# Patient Record
Sex: Male | Born: 1955 | Race: White | Hispanic: No | Marital: Married | Smoking: Never smoker
Health system: Southern US, Community
[De-identification: ages and names within clinical notes are randomized; demographics above are authoritative.]

## PROBLEM LIST (undated history)

## (undated) DIAGNOSIS — I2510 Atherosclerotic heart disease of native coronary artery without angina pectoris: Secondary | ICD-10-CM

## (undated) DIAGNOSIS — K8020 Calculus of gallbladder without cholecystitis without obstruction: Secondary | ICD-10-CM

## (undated) DIAGNOSIS — I779 Disorder of arteries and arterioles, unspecified: Secondary | ICD-10-CM

## (undated) DIAGNOSIS — I10 Essential (primary) hypertension: Secondary | ICD-10-CM

## (undated) DIAGNOSIS — R1319 Other dysphagia: Secondary | ICD-10-CM

## (undated) DIAGNOSIS — Z9889 Other specified postprocedural states: Secondary | ICD-10-CM

## (undated) DIAGNOSIS — E785 Hyperlipidemia, unspecified: Secondary | ICD-10-CM

## (undated) DIAGNOSIS — E119 Type 2 diabetes mellitus without complications: Secondary | ICD-10-CM

## (undated) DIAGNOSIS — R112 Nausea with vomiting, unspecified: Secondary | ICD-10-CM

## (undated) DIAGNOSIS — M79606 Pain in leg, unspecified: Secondary | ICD-10-CM

## (undated) DIAGNOSIS — Z9862 Peripheral vascular angioplasty status: Secondary | ICD-10-CM

## (undated) DIAGNOSIS — K222 Esophageal obstruction: Secondary | ICD-10-CM

## (undated) DIAGNOSIS — I739 Peripheral vascular disease, unspecified: Secondary | ICD-10-CM

## (undated) DIAGNOSIS — K219 Gastro-esophageal reflux disease without esophagitis: Secondary | ICD-10-CM

## (undated) DIAGNOSIS — D369 Benign neoplasm, unspecified site: Secondary | ICD-10-CM

## (undated) HISTORY — PX: KNEE ARTHROSCOPY: SUR90

## (undated) HISTORY — DX: Essential (primary) hypertension: I10

## (undated) HISTORY — DX: Pain in leg, unspecified: M79.606

## (undated) HISTORY — DX: Atherosclerotic heart disease of native coronary artery without angina pectoris: I25.10

## (undated) HISTORY — PX: APPENDECTOMY: SHX54

## (undated) HISTORY — DX: Benign neoplasm, unspecified site: D36.9

## (undated) HISTORY — DX: Other dysphagia: R13.19

## (undated) HISTORY — PX: OTHER SURGICAL HISTORY: SHX169

## (undated) HISTORY — PX: CORONARY ANGIOPLASTY WITH STENT PLACEMENT: SHX49

## (undated) HISTORY — DX: Calculus of gallbladder without cholecystitis without obstruction: K80.20

---

## 2004-05-13 ENCOUNTER — Emergency Department: Payer: Self-pay | Admitting: General Practice

## 2007-05-09 ENCOUNTER — Ambulatory Visit: Payer: Self-pay | Admitting: Unknown Physician Specialty

## 2008-08-10 ENCOUNTER — Ambulatory Visit: Payer: Self-pay | Admitting: Internal Medicine

## 2008-09-02 ENCOUNTER — Ambulatory Visit: Payer: Self-pay | Admitting: Internal Medicine

## 2008-10-03 ENCOUNTER — Ambulatory Visit: Payer: Self-pay | Admitting: Internal Medicine

## 2010-12-15 DIAGNOSIS — I2510 Atherosclerotic heart disease of native coronary artery without angina pectoris: Secondary | ICD-10-CM | POA: Insufficient documentation

## 2010-12-15 DIAGNOSIS — E785 Hyperlipidemia, unspecified: Secondary | ICD-10-CM | POA: Insufficient documentation

## 2010-12-15 HISTORY — DX: Atherosclerotic heart disease of native coronary artery without angina pectoris: I25.10

## 2011-08-10 ENCOUNTER — Ambulatory Visit: Payer: Self-pay | Admitting: Unknown Physician Specialty

## 2011-08-14 LAB — PATHOLOGY REPORT

## 2011-08-20 ENCOUNTER — Ambulatory Visit: Payer: Self-pay | Admitting: Unknown Physician Specialty

## 2011-08-21 DIAGNOSIS — K219 Gastro-esophageal reflux disease without esophagitis: Secondary | ICD-10-CM | POA: Insufficient documentation

## 2011-10-01 ENCOUNTER — Ambulatory Visit: Payer: Self-pay | Admitting: Surgery

## 2011-10-01 LAB — CBC
HCT: 37.8 % — ABNORMAL LOW (ref 40.0–52.0)
HGB: 13.3 g/dL (ref 13.0–18.0)
MCH: 29.3 pg (ref 26.0–34.0)
MCV: 83 fL (ref 80–100)
RBC: 4.56 10*6/uL (ref 4.40–5.90)
WBC: 5.7 10*3/uL (ref 3.8–10.6)

## 2011-10-11 ENCOUNTER — Ambulatory Visit: Payer: Self-pay | Admitting: Surgery

## 2011-10-15 LAB — PATHOLOGY REPORT

## 2012-02-05 ENCOUNTER — Emergency Department: Payer: Self-pay | Admitting: Emergency Medicine

## 2013-02-13 ENCOUNTER — Ambulatory Visit: Payer: Self-pay | Admitting: Family Medicine

## 2013-04-10 ENCOUNTER — Ambulatory Visit: Payer: Self-pay | Admitting: General Practice

## 2013-05-11 ENCOUNTER — Ambulatory Visit: Payer: Self-pay | Admitting: General Practice

## 2013-05-11 LAB — CBC WITH DIFFERENTIAL/PLATELET
Basophil #: 0 10*3/uL (ref 0.0–0.1)
Basophil %: 0.6 %
Eosinophil #: 0.3 10*3/uL (ref 0.0–0.7)
Eosinophil %: 5.7 %
HCT: 40.1 % (ref 40.0–52.0)
HGB: 13.9 g/dL (ref 13.0–18.0)
Lymphocyte #: 1.4 10*3/uL (ref 1.0–3.6)
Lymphocyte %: 26.9 %
MCV: 83 fL (ref 80–100)
Monocyte #: 0.4 x10 3/mm (ref 0.2–1.0)
Monocyte %: 7.2 %
NEUTROS PCT: 59.6 %
Neutrophil #: 3 10*3/uL (ref 1.4–6.5)
Platelet: 186 10*3/uL (ref 150–440)
RBC: 4.84 10*6/uL (ref 4.40–5.90)
WBC: 5 10*3/uL (ref 3.8–10.6)

## 2013-05-11 LAB — BASIC METABOLIC PANEL
Anion Gap: 4 — ABNORMAL LOW (ref 7–16)
BUN: 21 mg/dL — ABNORMAL HIGH (ref 7–18)
Calcium, Total: 8.8 mg/dL (ref 8.5–10.1)
Chloride: 105 mmol/L (ref 98–107)
Co2: 29 mmol/L (ref 21–32)
Creatinine: 0.62 mg/dL (ref 0.60–1.30)
EGFR (African American): >60
EGFR (Non-African Amer.): >60
GLUCOSE: 124 mg/dL — AB (ref 65–99)
Osmolality: 280 (ref 275–301)
Potassium: 4.2 mmol/L (ref 3.5–5.1)
SODIUM: 138 mmol/L (ref 136–145)

## 2013-05-18 ENCOUNTER — Ambulatory Visit: Payer: Self-pay | Admitting: General Practice

## 2013-07-27 DIAGNOSIS — M79606 Pain in leg, unspecified: Secondary | ICD-10-CM | POA: Insufficient documentation

## 2013-07-27 HISTORY — DX: Pain in leg, unspecified: M79.606

## 2013-10-06 ENCOUNTER — Ambulatory Visit: Payer: Self-pay | Admitting: General Practice

## 2013-10-12 ENCOUNTER — Ambulatory Visit: Payer: Self-pay | Admitting: General Practice

## 2013-10-12 LAB — CBC WITH DIFFERENTIAL/PLATELET
Basophil #: 0 x10 3/mm 3 (ref 0.0–0.1)
Basophil %: 0.7 %
Eosinophil #: 0.2 x10 3/mm 3 (ref 0.0–0.7)
Eosinophil %: 5 %
HCT: 40.3 % (ref 40.0–52.0)
HGB: 13.4 g/dL (ref 13.0–18.0)
Lymphocyte %: 29 %
Lymphs Abs: 1.4 x10 3/mm 3 (ref 1.0–3.6)
MCH: 28.6 pg (ref 26.0–34.0)
MCHC: 33.4 g/dL (ref 32.0–36.0)
MCV: 86 fL (ref 80–100)
Monocyte #: 0.4 x10 3/mm (ref 0.2–1.0)
Monocyte %: 8.2 %
Neutrophil #: 2.7 x10 3/mm 3 (ref 1.4–6.5)
Neutrophil %: 57.1 %
Platelet: 177 x10 3/mm 3 (ref 150–440)
RBC: 4.7 x10 6/mm 3 (ref 4.40–5.90)
RDW: 14.1 % (ref 11.5–14.5)
WBC: 4.8 x10 3/mm 3 (ref 3.8–10.6)

## 2013-10-12 LAB — BASIC METABOLIC PANEL WITH GFR
Anion Gap: 5 — ABNORMAL LOW (ref 7–16)
BUN: 18 mg/dL (ref 7–18)
Calcium, Total: 8.4 mg/dL — ABNORMAL LOW (ref 8.5–10.1)
Chloride: 110 mmol/L — ABNORMAL HIGH (ref 98–107)
Co2: 27 mmol/L (ref 21–32)
Creatinine: 0.73 mg/dL (ref 0.60–1.30)
EGFR (African American): >60
EGFR (Non-African Amer.): >60
Glucose: 118 mg/dL — ABNORMAL HIGH (ref 65–99)
Osmolality: 286 (ref 275–301)
Potassium: 4.8 mmol/L (ref 3.5–5.1)
Sodium: 142 mmol/L (ref 136–145)

## 2013-10-19 ENCOUNTER — Ambulatory Visit: Payer: Self-pay | Admitting: General Practice

## 2014-06-22 NOTE — Op Note (Signed)
PATIENT NAME:  Jeremy Luna, Jeremy Luna MR#:  [id number] DATE OF BIRTH:  06-15-1955  DATE OF PROCEDURE:  10/11/2011  PREOPERATIVE DIAGNOSIS: Appendiceal mass.   POSTOPERATIVE DIAGNOSIS: Appendiceal mass.   PROCEDURE: Laparoscopic appendectomy.   SURGEON: Loreli Dollar, MD  ANESTHESIA: General.   INDICATIONS: This 59 year old male recently had colonoscopy with findings of prominent appearance of the appendiceal lumen, evidence of mass formation. CT findings were consistent with this and laparoscopic appendectomy was recommended for definitive treatment.   DESCRIPTION OF PROCEDURE: The patient was placed on the operating table in the supine position under general anesthesia. The abdomen was prepared with ChloraPrep, draped in a sterile manner.   A short incision was made in the inferior aspect of the umbilicus and carried down to the deep fascia which was grasped with laryngeal hook and elevated. A Veress needle was inserted, aspirated, and irrigated with a saline solution. Next, the peritoneal cavity was inflated with carbon dioxide. The Veress needle was removed. The 10 mm cannula was inserted. The 10 mm, 0 degree laparoscope was inserted to view the peritoneal cavity. Another incision was made in the left lower quadrant to insert a 12 mm cannula lateral to the inferior epigastric vessels. Another incision was made in the right lower quadrant to introduce a 5 mm cannula lateral to the inferior epigastric vessels. The cecum was identified. The patient was placed in Trendelenburg position and turned towards the left. The appendix was grasped with Babcock clamp and elevated. The appendix was observed. There appeared to be some bulbous prominence of the appendix at its proximal end adjacent to the cecum. A window was created in the mesoappendix and further enlarged this incision. The blue cartridge Endo GIA stapler was placed across the cecum just about a centimeter above the appendix and was engaged and  activated and then with another load of the blue cartridge the wedge of the cecum was separated placing three rows of staples on each side. Next, the mesoappendix was divided with three loads of the white cartridge Endo GIA stapler. There was some minimal bleeding. Several small bleeding points were cauterized. Some blood was aspirated and hemostasis subsequently appeared to be intact. The appendix was placed into an Endo Catch bag and brought out through the 12 mm port site. It was opened on the side table so that the staple line was partially excised and identified a small area of mass formation and submitted in formalin for routine pathology. Gloves were changed.  The wound was inspected. Just a small amount of blood was aspirated and this operative site was observed for several minutes and subsequently appeared that hemostasis was intact. Next, the cannulas were removed seeing no bleeding from the cannula sites and the carbon dioxide was allowed to escape from the peritoneal cavity. The skin incisions were closed with interrupted 5-0 chromic subcuticular sutures, benzoin, and Steri-Strips. Dressings were applied with paper tape. The patient tolerated surgery satisfactorily and was prepared for transfer to the recovery room.  ____________________________ Lenna Sciara. Rochel Brome, MD jws:cms D: 10/11/2011 08:57:48 ET T: 10/11/2011 09:10:21 ET JOB#: [id number]  cc: Loreli Dollar, MD, <Dictator> Loreli Dollar MD ELECTRONICALLY SIGNED 10/12/2011 19:45

## 2014-06-26 NOTE — Op Note (Signed)
PATIENT NAME:  Jeremy Luna, Jeremy Luna MR#:  [id number] DATE OF BIRTH:  [birth year]  DATE OF PROCEDURE:  10/19/2013  PREOPERATIVE DIAGNOSIS: Internal derangement of the left knee.   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   PROCEDURE PERFORMED: Left knee arthroscopy, partial medial meniscectomy and chondroplasty.   SURGEON: Skip Estimable, MD   ANESTHESIA: General.   ESTIMATED BLOOD LOSS: Minimal.   TOURNIQUET TIME: Not used.   DRAINS: None.   INDICATIONS FOR SURGERY: The patient is a 59 year old male who previously underwent left knee arthroscopy and medial meniscectomy. He had done well until sustaining a fall, at which point he had recurrent swelling of the knee as well as medial joint line pain. After discussion of the risks and benefits of surgical intervention, the patient expressed understanding of the risks and benefits and agreed with plans for surgical intervention.   PROCEDURE IN DETAIL: The patient was brought to the Operating Room and, after adequate general anesthesia was achieved, a tourniquet was placed on the patient's left thigh, and leg was placed in a leg holder. All bony prominences were well padded. The patient's left knee and leg were cleaned and prepped with alcohol and DuraPrep, draped in the usual sterile fashion. A "timeout" was performed as per usual protocol. The anticipated portal sites were injected with 0.25% Marcaine with epinephrine. An anterolateral portal was created and a cannula was inserted. A large bloody effusion was evacuated. The scope was inserted and the knee was distended with fluid using the pump. The scope was advanced down the medial gutter into the medial compartment of the knee. Under visualization with the scope, an anteromedial portal was created and a hook probe was inserted. Inspection of the medial compartment  demonstrated a tear of the posterior horn of the medial meniscus. This was debrided using meniscal punches and a 4.5 mm shaver. Final contouring was performed using an 80 degree ArthroCare wand. The remaining rim of meniscus was visualized and probed and felt to be stable. With range of motion of the knee, the medial femoral condyle was better visualized. There were areas of grade 3 chondromalacia with lesser grade 2 changes to the medial tibial plateau. These areas were debrided and contoured using the ArthroCare device. The knee was removed from the anterolateral portal and reinserted via the anteromedial portal. The scope was positioned so as to visualize the patellofemoral articulation and the notch. The anterior cruciate ligament was visualized and probed and felt to be stable. Good patellar tracking was noted. There was localized round area to the lateral femoral condyle, and this area was debrided using the ArthroCare paragon device. The scope was positioned so as to visualize the lateral compartment. The lateral meniscus was visualized and probed and felt to be stable. The articular surface was in good condition. Finally, the scope was positioned so as to visualize the patellofemoral articulation. The articular surface was in good condition, good patellar tracking was noted. It should be noted that the synovial tissue was extremely inflamed and irritated, was debrided and cauterized using ArthroCare wand. The knee was irrigated with copious amounts of fluid and suctioned dry. The anterolateral portal was reapproximated using 3-0 nylon. A combination of 0.25% Marcaine with epinephrine and 4 mg morphine was injected via the scope. The scope was removed and the anteromedial portal was reapproximated using 3-0 nylon. A sterile dressing was applied followed by application of ice wrap.  The patient tolerated the procedure well. He was transported to the recovery room in stable  condition.   ____________________________ Laurice Record. Holley Bouche., MD jph:TT D: 10/19/2013 17:49:29 ET T: 10/19/2013 20:49:31 ET JOB#: [id number]  cc: Laurice Record. Holley Bouche., MD, <Dictator> JAMES P Holley Bouche MD ELECTRONICALLY SIGNED 11/11/2013 7:08

## 2014-06-26 NOTE — Op Note (Signed)
PATIENT NAME:  Jeremy Luna, Jeremy Luna MR#:  [id number] DATE OF BIRTH:  1955/12/20  DATE OF PROCEDURE:  05/18/2013  PREOPERATIVE DIAGNOSIS: Internal derangement of the left knee.   POSTOPERATIVE DIAGNOSES:  1.  Tear of the posterior horn medial meniscus, left knee.  2.  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   PROCEDURE PERFORMED: Left knee arthroscopy, partial medial meniscectomy and medial chondroplasty.   SURGEON: Laurice Record. Hooten, MD.   ANESTHESIA: General.   ESTIMATED BLOOD LOSS: Minimal.   TOURNIQUET TIME: Not used.   DRAINS: None.   FLUIDS REPLACED: 700 mL of crystalloid.   INDICATIONS FOR SURGERY: The patient is a 59 year old gentleman, who has been seen for complaints of persistent left knee pain and swelling. MRI demonstrated findings consistent with meniscal pathology. After discussion of the risks and benefits of surgical intervention, the patient expressed understanding of the risks and benefits, and agreed with plans for surgical intervention.   PROCEDURE IN DETAIL: The patient was brought in the operating room and then, after adequate general anesthesia was achieved, a tourniquet was placed on the patient's left thigh and placed a leg holder. All bony prominences were well padded. The patient's left knee and leg were cleaned and prepped with alcohol and DuraPrep and draped in the usual sterile fashion. A "timeout" was performed as per usual protocol. The anticipated portal sites were injected with 0.25% Marcaine with epinephrine. An anterolateral portal was created and a cannula was inserted. A large effusion was evacuated. The scope was inserted and the knee was tented with fluid using the Linvatec pump. The scope was advanced down the medial gutter and into the medial compartment of the knee. Under visualization with the scope, an anteromedial portal was created and hook probe was inserted. There was a complex tear of the posterior horn of the medial meniscus.  This was debrided using a combination of meniscal punches and a 4.5 mm shaver. The remaining rim of meniscus was visualized and probed and felt to be stable.   Inspection of the articular surface demonstrated some grade 3 changes of chondromalacia involving the medial femoral condyle. These areas were debrided and contoured using the 50 degree ArthroCare wand. The scope was then advanced into the intracondylar region. The anterior cruciate ligament was visualized and probed and felt to be stable. The scope was removed from the anterolateral portal and reinserted in the anteromedial portal so as to better visualize the lateral compartment. The lateral meniscus was visualized and probed and felt to be stable. The articular surface was visualized and felt to be in excellent condition. Finally, the scope was positioned so as to visualize the patellofemoral articulation. Good patellar tracking was noted. The articular surface was in good condition.   The knee was irrigated with copious amounts of fluid and then suctioned dry. The anterolateral portal was reapproximated using 3-0 nylon. A combination of 0.25% Marcaine with epinephrine and 4 mg morphine was injected via the scope. The scope was removed and the anteromedial portal was reapproximated using 3-0 nylon. Sterile dressing was applied followed by application of an Ace wrap. The patient tolerated the procedure well. He was transported to the recovery room in stable condition.   ____________________________ Laurice Record. Holley Bouche., MD jph:aw D: 05/18/2013 23:03:50 ET T: 05/19/2013 07:47:24 ET JOB#: [id number]  cc: Laurice Record. Holley Bouche., MD, <Dictator> Laurice Record Holley Bouche MD ELECTRONICALLY SIGNED 05/24/2013 8:44

## 2014-09-27 DIAGNOSIS — E119 Type 2 diabetes mellitus without complications: Secondary | ICD-10-CM | POA: Insufficient documentation

## 2015-01-22 ENCOUNTER — Encounter: Payer: Self-pay | Admitting: Gynecology

## 2015-01-22 ENCOUNTER — Ambulatory Visit
Admission: EM | Admit: 2015-01-22 | Discharge: 2015-01-22 | Disposition: A | Discharge status: Home / Self Care | Payer: BLUE CROSS/BLUE SHIELD | Attending: Internal Medicine | Admitting: Internal Medicine

## 2015-01-22 DIAGNOSIS — S91331A Puncture wound without foreign body, right foot, initial encounter: Secondary | ICD-10-CM

## 2015-01-22 HISTORY — DX: Essential (primary) hypertension: I10

## 2015-01-22 HISTORY — DX: Gastro-esophageal reflux disease without esophagitis: K21.9

## 2015-01-22 HISTORY — DX: Hyperlipidemia, unspecified: E78.5

## 2015-01-22 HISTORY — DX: Peripheral vascular angioplasty status: Z98.62

## 2015-01-22 HISTORY — DX: Type 2 diabetes mellitus without complications: E11.9

## 2015-01-22 HISTORY — DX: Atherosclerotic heart disease of native coronary artery without angina pectoris: I25.10

## 2015-01-22 MED ORDER — TETANUS-DIPHTH-ACELL PERTUSSIS 5-2.5-18.5 LF-MCG/0.5 IM SUSP
0.5000 mL | Freq: Once | INTRAMUSCULAR | Status: AC
  Administered 2015-01-22: 0.5 mL via INTRAMUSCULAR

## 2015-01-22 MED ORDER — AMOXICILLIN-POT CLAVULANATE 875-125 MG PO TABS: 1.0000 | ORAL_TABLET | Freq: Two times a day (BID) | ORAL | Status: AC

## 2015-01-22 NOTE — Discharge Instructions (Signed)
Tetanus shot was given at the urgent care today. Prescription for augmentin (amoxicillin/clavulanate, an antibiotic) was sent to the [facility] in [place] for any increasing redness/swelling/pain/drainage, or new fever >100.5  Puncture Wound A puncture wound is an injury that is caused by a sharp, thin object that goes through (penetrates) your skin. Usually, a puncture wound does not leave a large opening in your skin, so it may not bleed a lot. However, when you get a puncture wound, dirt or other materials (foreign bodies) can be forced into your wound and break off inside. This increases the chance of infection, such as tetanus. CAUSES Puncture wounds are caused by any sharp, thin object that goes through your skin, such as:  Animal teeth, as with an animal bite.  Sharp, pointed objects, such as nails, splinters of glass, fishhooks, and needles. SYMPTOMS Symptoms of a puncture wound include:  Pain.  Bleeding.  Swelling.  Bruising.  Fluid leaking from the wound.  Numbness, tingling, or loss of function. DIAGNOSIS This condition is diagnosed with a medical history and physical exam. Your wound will be checked to see if it contains any foreign bodies. You may also have X-rays or other imaging tests. TREATMENT Treatment for a puncture wound depends on how serious the wound is. It also depends on whether the wound contains any foreign bodies. Treatment for all types of puncture wounds usually starts with:  Controlling the bleeding.  Washing out the wound with a germ-free (sterile) salt-water solution.  Checking the wound for foreign bodies. Treatment may also include:  Having the wound opened surgically to remove a foreign object.  Closing the wound with stitches (sutures) if it continues to bleed.  Covering the wound with antibiotic ointments and a bandage (dressing).  Receiving a tetanus shot.  Receiving a rabies vaccine. HOME CARE INSTRUCTIONS Medicines  Take or  apply over-the-counter and prescription medicines only as told by your health care provider.  If you were prescribed an antibiotic, take or apply it as told by your health care provider. Do not stop using the antibiotic even if your condition improves. Wound Care  There are many ways to close and cover a wound. For example, a wound can be covered with sutures, skin glue, or adhesive strips. Follow instructions from your health care provider about:  How to take care of your wound.  When and how you should change your dressing.  When you should remove your dressing.  Removing whatever was used to close your wound.  Keep the dressing dry as told by your health care provider. Do not take baths, swim, use a hot tub, or do anything that would put your wound underwater until your health care provider approves.  Clean the wound as told by your health care provider.  Do not scratch or pick at the wound.  Check your wound every day for signs of infection. Watch for:  Redness, swelling, or pain.  Fluid, blood, or pus. General Instructions  Raise (elevate) the injured area above the level of your heart while you are sitting or lying down.  If your puncture wound is in your foot, ask your health care provider if you need to avoid putting weight on your foot and for how long.  Keep all follow-up visits as told by your health care provider. This is important. SEEK MEDICAL CARE IF:  You received a tetanus shot and you have swelling, severe pain, redness, or bleeding at the injection site.  You have a fever.  Your  sutures come out.  You notice a bad smell coming from your wound or your dressing.  You notice something coming out of your wound, such as wood or glass.  Your pain is not controlled with medicine.  You have increased redness, swelling, or pain at the site of your wound.  You have fluid, blood, or pus coming from your wound.  You notice a change in the color of your skin  near your wound.  You need to change the dressing frequently due to fluid, blood, or pus draining from your wound.  You develop a new rash.  You develop numbness around your wound. SEEK IMMEDIATE MEDICAL CARE IF:  You develop severe swelling around your wound.  Your pain suddenly increases and is severe.  You develop painful skin lumps.  You have a red streak going away from your wound.  The wound is on your hand or foot and you cannot properly move a finger or toe.  The wound is on your hand or foot and you notice that your fingers or toes look pale or bluish.   This information is not intended to replace advice given to you by your health care provider. Make sure you discuss any questions you have with your health care provider.   Document Released: 11/29/2004 Document Revised: 11/10/2014 Document Reviewed: 04/14/2014 Elsevier Interactive Patient Education [employer].

## 2015-01-22 NOTE — ED Notes (Signed)
Patient c/o nail puncture in right foot x yesterday .

## 2015-01-22 NOTE — ED Provider Notes (Signed)
CSN: [id number]     Arrival date & time 01/22/15  0840 History   First MD Initiated Contact with Patient 01/22/15 [phone]     Chief Complaint  Patient presents with  . Foot Injury   HPI  Patient is a 59 year old gentleman with history of diabetes. He stepped on a nail while walking near his burn pile yesterday. The nail was about an inch and a half long, and penetrated the sole of his right foot, at the ball, through his boot. Penetration was not deep. He is here to have this checked today and get a tetanus update. He was able to walk around all day yesterday, and walked into the urgent care today without assistive device or difficulty. No fever, no pain/swelling/redness/drainage at site. Feels fine otherwise  Past Medical History  Diagnosis Date  . Hypertension   . Diabetes mellitus without complication ([place])   . Dyslipidemia   . H/O angioplasty   . GERD (gastroesophageal reflux disease)   . CAD (coronary artery disease)    Past Surgical History  Procedure Laterality Date  . Colonscopy    . Coronary angioplasty with stent placement    . Appendectomy    . Knee arthroscopy Left     Social History  Substance Use Topics  . Smoking status: Never Smoker   . Smokeless tobacco: None  . Alcohol Use: Yes    Review of Systems  All other systems reviewed and are negative.   Allergies  Review of patient's allergies indicates no known allergies.  Home Medications   Prior to Admission medications   Medication Sig Start Date End Date Taking? Authorizing Provider  aspirin 81 MG tablet Take 81 mg by mouth daily.   Yes Historical Provider, MD  atorvastatin (LIPITOR) 40 MG tablet Take 40 mg by mouth daily.   Yes Historical Provider, MD  lisinopril (PRINIVIL,ZESTRIL) 20 MG tablet Take 20 mg by mouth daily.   Yes Historical Provider, MD  metFORMIN (GLUCOPHAGE) 500 MG tablet Take by mouth 2 (two) times daily with a meal.   Yes Historical Provider, MD  naproxen sodium (ANAPROX) 220 MG tablet  Take 220 mg by mouth 2 (two) times daily with a meal.   Yes Historical Provider, MD  nitroGLYCERIN (NITROSTAT) 0.4 MG SL tablet Place 0.4 mg under the tongue every 5 (five) minutes as needed for chest pain.   Yes Historical Provider, MD          Meds Ordered and Administered this Visit   Medications  Tdap (BOOSTRIX) injection 0.5 mL (0.5 mLs Intramuscular Given 01/22/15 0918)    BP 120/71 mmHg  Pulse 65  Temp(Src) 98 F (36.7 C) (Oral)  Resp 18  Ht 5\' 10"  (1.778 m)  Wt 200 lb 6.4 oz (90.901 kg)  BMI 28.75 kg/m2  SpO2 98%   Physical Exam  Constitutional: He is oriented to person, place, and time. No distress.  Alert, nicely groomed  HENT:  Head: Atraumatic.  Eyes:  Conjugate gaze, no eye redness/drainage  Neck: Neck supple.  Cardiovascular: Normal rate.   Pulmonary/Chest: No respiratory distress.  Abdominal: Soft. He exhibits no distension.  Musculoskeletal: He exhibits no edema.  Neurological: He is alert and oriented to person, place, and time.  Skin: Skin is warm and dry.  No cyanosis Skin of the feet is a little atrophic Ball of the right foot with tiny puncture mark at approximately the level of the head of the second metatarsal. No redness/swelling, nontender, not able to express any drainage  or bleeding. Not bruised.  Nursing note and vitals reviewed.   ED Course  Procedures (including critical care time)      MDM   1. Puncture wound of foot, right, initial encounter    Discharge Medication List as of 01/22/2015  9:26 AM    START taking these medications   Details  amoxicillin-clavulanate (AUGMENTIN) 875-125 MG tablet Take 1 tablet by mouth every 12 (twelve) hours., Starting 01/22/2015, Until Thu 01/27/15, Normal       Recheck for any increased redness/swelling/pain/drainage, new fever greater than 100.5. Has a history of vascular disease and diabetes, prophylactic antibiotics are given today.    Sherlene Shams, MD 01/22/15 [phone]

## 2016-04-02 DIAGNOSIS — D369 Benign neoplasm, unspecified site: Secondary | ICD-10-CM

## 2016-04-02 DIAGNOSIS — I10 Essential (primary) hypertension: Secondary | ICD-10-CM

## 2016-04-02 HISTORY — DX: Benign neoplasm, unspecified site: D36.9

## 2016-04-02 HISTORY — DX: Essential (primary) hypertension: I10

## 2016-06-22 ENCOUNTER — Other Ambulatory Visit: Payer: Self-pay | Admitting: Nurse Practitioner

## 2016-06-22 DIAGNOSIS — R1319 Other dysphagia: Secondary | ICD-10-CM

## 2016-06-22 HISTORY — DX: Other dysphagia: R13.19

## 2016-06-27 ENCOUNTER — Ambulatory Visit
Admission: RE | Admit: 2016-06-27 | Discharge: 2016-06-27 | Disposition: A | Discharge status: Home / Self Care | Payer: BLUE CROSS/BLUE SHIELD | Source: Ambulatory Visit | Attending: Nurse Practitioner | Admitting: Nurse Practitioner

## 2016-06-27 DIAGNOSIS — K224 Dyskinesia of esophagus: Secondary | ICD-10-CM | POA: Diagnosis not present

## 2016-06-27 DIAGNOSIS — K449 Diaphragmatic hernia without obstruction or gangrene: Secondary | ICD-10-CM | POA: Diagnosis not present

## 2016-06-27 DIAGNOSIS — K219 Gastro-esophageal reflux disease without esophagitis: Secondary | ICD-10-CM | POA: Diagnosis not present

## 2016-06-27 DIAGNOSIS — K222 Esophageal obstruction: Secondary | ICD-10-CM | POA: Diagnosis not present

## 2016-06-27 DIAGNOSIS — R1319 Other dysphagia: Secondary | ICD-10-CM

## 2016-07-23 ENCOUNTER — Ambulatory Visit
Admission: EM | Admit: 2016-07-23 | Discharge: 2016-07-23 | Disposition: A | Discharge status: Home / Self Care | Payer: BLUE CROSS/BLUE SHIELD | Attending: Family Medicine | Admitting: Family Medicine

## 2016-07-23 ENCOUNTER — Ambulatory Visit (INDEPENDENT_AMBULATORY_CARE_PROVIDER_SITE_OTHER)
Admit: 2016-07-23 | Discharge: 2016-07-23 | Disposition: A | Discharge status: Home / Self Care | Payer: BLUE CROSS/BLUE SHIELD | Attending: Family Medicine | Admitting: Family Medicine

## 2016-07-23 DIAGNOSIS — K8020 Calculus of gallbladder without cholecystitis without obstruction: Secondary | ICD-10-CM

## 2016-07-23 LAB — URINALYSIS, COMPLETE (UACMP) WITH MICROSCOPIC
Bacteria, UA: NONE SEEN
Bilirubin Urine: NEGATIVE
Glucose, UA: 100 mg/dL — AB
Hgb urine dipstick: NEGATIVE
Leukocytes, UA: NEGATIVE
Nitrite: NEGATIVE
pH: 5 (ref 5.0–8.0)

## 2016-07-23 LAB — CBC WITH DIFFERENTIAL/PLATELET
Basophils Absolute: 0.1 10*3/uL (ref 0–0.1)
Basophils Relative: 1 %
EOS ABS: 0.3 10*3/uL (ref 0–0.7)
Eosinophils Relative: 4 %
HCT: 42 % (ref 40.0–52.0)
HEMOGLOBIN: 14 g/dL (ref 13.0–18.0)
LYMPHS ABS: 1.7 10*3/uL (ref 1.0–3.6)
Lymphocytes Relative: 24 %
MCH: 28.1 pg (ref 26.0–34.0)
MCHC: 33.4 g/dL (ref 32.0–36.0)
MCV: 84 fL (ref 80.0–100.0)
Monocytes Absolute: 0.5 10*3/uL (ref 0.2–1.0)
Monocytes Relative: 7 %
NEUTROS ABS: 4.5 10*3/uL (ref 1.4–6.5)
Neutrophils Relative %: 64 %
Platelets: 180 10*3/uL (ref 150–440)
RBC: 5 MIL/uL (ref 4.40–5.90)
RDW: 13.7 % (ref 11.5–14.5)
WBC: 7 10*3/uL (ref 3.8–10.6)

## 2016-07-23 LAB — COMPREHENSIVE METABOLIC PANEL
ALBUMIN: 4.5 g/dL (ref 3.5–5.0)
ALK PHOS: 67 U/L (ref 38–126)
ALT: 22 U/L (ref 17–63)
ANION GAP: 7 (ref 5–15)
AST: 21 U/L (ref 15–41)
BILIRUBIN TOTAL: 1.1 mg/dL (ref 0.3–1.2)
BUN: 27 mg/dL — ABNORMAL HIGH (ref 6–20)
CALCIUM: 9.2 mg/dL (ref 8.9–10.3)
CO2: 26 mmol/L (ref 22–32)
Chloride: 105 mmol/L (ref 101–111)
Creatinine, Ser: 0.71 mg/dL (ref 0.61–1.24)
GFR calc non Af Amer: >60 mL/min (ref >60)
Glucose, Bld: 137 mg/dL — ABNORMAL HIGH (ref 65–99)
POTASSIUM: 4.6 mmol/L (ref 3.5–5.1)
SODIUM: 138 mmol/L (ref 135–145)
TOTAL PROTEIN: 7.1 g/dL (ref 6.5–8.1)

## 2016-07-23 NOTE — ED Triage Notes (Signed)
Patient complains of right side flank pain that started last night radiating to the front. Patient states that he took ibuprofen and eased off some. Patient states that this morning he feels full and also had some burning with urination.

## 2016-07-23 NOTE — ED Provider Notes (Signed)
[facility] ____________________________________________  Time seen: Approximately 8:32 AM  I have reviewed the triage vital signs and the nursing notes.   HISTORY  Chief Complaint Flank Pain and Dysuria  HPI Jeremy Luna is a 61 y.o. male presenting for evaluation of right upper quadrant and right flank pain with single episode of dysuria that happened last night. Patient reports he was sitting at home and had onset of right flank pain that was present and described as severe between 10 PM and midnight and then improved. Patient reports he had single episode of some burning with urination but has not had any since. Denies any blood in urine, bowel issues, a decreased urinary stream or flow. Denies any penile discharge, penile or testicular pain or swelling. Denies any rectal pain. Reports continues with normal bowel movements, last bowel movement was today. Denies food triggers or history of similar in the past.  Denies fall, injury or trauma. Patient reports holding pressure to his right upper quadrant and right side as well as taking over-the-counter ibuprofen did help with discomfort. Denies any aggravating factors. Reports no previous abdominal surgeries. Denies pain radiation. Denies history of kidney stones.Denies concerns of STDs.   Denies chest pain, shortness of breath, nausea, vomiting, diarrhea, fevers, dysuria, extremity pain, extremity swelling or rash. Denies recent sickness. Denies recent antibiotic use.   PCP Loleta Chance  Past Medical History:  Diagnosis Date  . CAD (coronary artery disease)   . Diabetes mellitus without complication ([place])   . Dyslipidemia   . GERD (gastroesophageal reflux disease)   . H/O angioplasty   . Hypertension     There are no active problems to display for this patient.   Past Surgical History:  Procedure Laterality Date  . APPENDECTOMY    . colonscopy    . CORONARY ANGIOPLASTY WITH STENT PLACEMENT    . KNEE ARTHROSCOPY Left       No current facility-administered medications for this encounter.   Current Outpatient Prescriptions:  .  aspirin 81 MG tablet, Take 81 mg by mouth daily., Disp: , Rfl:  .  atorvastatin (LIPITOR) 40 MG tablet, Take 40 mg by mouth daily., Disp: , Rfl:  .  lisinopril (PRINIVIL,ZESTRIL) 20 MG tablet, Take 20 mg by mouth daily., Disp: , Rfl:  .  metFORMIN (GLUCOPHAGE) 500 MG tablet, Take by mouth 2 (two) times daily with a meal., Disp: , Rfl:  .  naproxen sodium (ANAPROX) 220 MG tablet, Take 220 mg by mouth 2 (two) times daily with a meal., Disp: , Rfl:  .  nitroGLYCERIN (NITROSTAT) 0.4 MG SL tablet, Place 0.4 mg under the tongue every 5 (five) minutes as needed for chest pain., Disp: , Rfl:  .  omeprazole (PRILOSEC) 20 MG capsule, Take 20 mg by mouth daily., Disp: , Rfl:   Allergies Patient has no known allergies.  family history Father: DM, MI  Social History Social History  Substance Use Topics  . Smoking status: Never Smoker  . Smokeless tobacco: Never Used  . Alcohol use Yes    Review of Systems Constitutional: No fever/chills Cardiovascular: Denies chest pain. Respiratory: Denies shortness of breath. Gastrointestinal: No nausea, no vomiting.  No diarrhea.  No constipation. Genitourinary: As above.  Musculoskeletal: Negative for back pain. Skin: Negative for rash.   ____________________________________________   PHYSICAL EXAM:  VITAL SIGNS: ED Triage Vitals  Enc Vitals Group     BP 07/23/16 0824 128/81     Pulse Rate 07/23/16 0824 65  Resp 07/23/16 0824 18     Temp 07/23/16 0824 97.7 F (36.5 C)     Temp Source 07/23/16 0824 Oral     SpO2 07/23/16 0824 97 %     Weight 07/23/16 0822 199 lb (90.3 kg)     Height 07/23/16 0822 5\' 11"  (1.803 m)     Head Circumference --      Peak Flow --      Pain Score 07/23/16 0823 2     Pain Loc --      Pain Edu? --      Excl. in [place]? --     Constitutional: Alert and oriented. Well appearing and in no acute  distress. ENT      Head: Normocephalic and atraumatic. Cardiovascular: Normal rate, regular rhythm. Grossly normal heart sounds.  Good peripheral circulation. Respiratory: Normal respiratory effort without tachypnea nor retractions. Breath sounds are clear and equal bilaterally. No wheezes, rales, rhonchi. Gastrointestinal: Soft. No distention. Normal Bowel sounds. Minimal RUQ and right flank pain, abdomen otherwise soft and nontender.  Musculoskeletal:  Steady gait. No midline cervical, thoracic or lumbar tenderness to palpation. Neurologic:  Normal speech and language. Speech is normal. No gait instability.  Skin:  Skin is warm, dry. Psychiatric: Mood and affect are normal. Speech and behavior are normal. Patient exhibits appropriate insight and judgment   ___________________________________________   LABS (all labs ordered are listed, but only abnormal results are displayed)  Labs Reviewed  URINALYSIS, COMPLETE (UACMP) WITH MICROSCOPIC - Abnormal; Notable for the following:       Result Value   Specific Gravity, Urine >1.030 (*)    Glucose, UA 100 (*)    Ketones, ur TRACE (*)    Protein, ur TRACE (*)    Squamous Epithelial / LPF 0-5 (*)    All other components within normal limits  COMPREHENSIVE METABOLIC PANEL - Abnormal; Notable for the following:    Glucose, Bld 137 (*)    BUN 27 (*)    All other components within normal limits  URINE CULTURE  CBC WITH DIFFERENTIAL/PLATELET    RADIOLOGY  Ct Renal Stone Study  Result Date: 07/23/2016 CLINICAL DATA:  Right upper quadrant and right flank pain. EXAM: CT ABDOMEN AND PELVIS WITHOUT CONTRAST TECHNIQUE: Multidetector CT imaging of the abdomen and pelvis was performed following the standard protocol without IV contrast. COMPARISON:  08/20/2011 FINDINGS: Lower chest: Dense coronary artery calcifications diffusely throughout the visualized coronary arteries. Heart is normal size. No effusions. Lung bases clear. Hepatobiliary: Small  stones within the gallbladder. No focal hepatic abnormality. Pancreas: No focal abnormality or ductal dilatation. Spleen: No focal abnormality.  Normal size. Adrenals/Urinary Tract: No adrenal abnormality. No focal renal abnormality. No stones or hydronephrosis. Urinary bladder is unremarkable. Stomach/Bowel: Prior appendectomy. Scattered sigmoid diverticula. Stomach and small bowel unremarkable. Vascular/Lymphatic: Scattered aortic calcifications. No evidence of aneurysm or adenopathy. Reproductive: No visible focal abnormality.  Prostate enlargement. Other: No free fluid or free air. Musculoskeletal: No acute bony abnormality. IMPRESSION: Cholelithiasis. No renal or ureteral stones.  No hydronephrosis. Diffuse extensive coronary artery disease. Electronically Signed   By: Rolm Baptise M.D.   On: 07/23/2016 10:06   ____________________________________________   PROCEDURES Procedures   INITIAL IMPRESSION / ASSESSMENT AND PLAN / ED COURSE  Pertinent labs & imaging results that were available during my care of the patient were reviewed by me and considered in my medical decision making (see chart for details).  Well-appearing patient. No acute distress. Discussed with patient concern for nephrolithiasis  versus cholelithiasis. Discussed the patient will evaluate laboratory studies as well CT renal. Labs reviewed and discussed with patient. CT renal per radiologist cholelithiasis, no renal or ureteral stones, no hydronephrosis and diffuse extensive coronary artery disease. Discussed these results in detail with patient. Patient states at this time he is not having any pain. Patient tolerating fluids well in urgent care. 2 ties complaints at this time. Discussed with patient and encourage follow-up with primary care physician and likely outpatient ultrasound. Also discussed with patient diet contributions to gallstones. Encouraged supportive care and outpatient follow-up. Discussed strict follow-up and  return parameters.  Discussed follow up with Primary care physician this week. Discussed follow up and return parameters including no resolution or any worsening concerns. Patient verbalized understanding and agreed to plan.   ____________________________________________   FINAL CLINICAL IMPRESSION(S) / ED DIAGNOSES  Final diagnoses:  Calculus of gallbladder without cholecystitis without obstruction     Discharge Medication List as of 07/23/2016 10:34 AM      Note: This dictation was prepared with Dragon dictation along with smaller phrase technology. Any transcriptional errors that result from this process are unintentional.         Marylene Land, NP 07/23/16 2139

## 2016-07-23 NOTE — Discharge Instructions (Signed)
Monitor diet.   Follow up with your primary care physician tin one week as discussed.  Return to Urgent care for new or worsening concerns. \

## 2016-07-23 NOTE — ED Notes (Signed)
Prior Authorization obtained for CT Renal- AUTH [id number].

## 2016-07-24 LAB — URINE CULTURE: CULTURE: NO GROWTH

## 2016-07-25 ENCOUNTER — Emergency Department: Payer: BLUE CROSS/BLUE SHIELD

## 2016-07-25 ENCOUNTER — Encounter: Payer: Self-pay | Admitting: Emergency Medicine

## 2016-07-25 ENCOUNTER — Emergency Department
Admission: EM | Admit: 2016-07-25 | Discharge: 2016-07-25 | Disposition: A | Discharge status: Home / Self Care | Payer: BLUE CROSS/BLUE SHIELD | Attending: Emergency Medicine | Admitting: Emergency Medicine

## 2016-07-25 DIAGNOSIS — K8020 Calculus of gallbladder without cholecystitis without obstruction: Secondary | ICD-10-CM | POA: Diagnosis not present

## 2016-07-25 DIAGNOSIS — I10 Essential (primary) hypertension: Secondary | ICD-10-CM | POA: Insufficient documentation

## 2016-07-25 DIAGNOSIS — I2510 Atherosclerotic heart disease of native coronary artery without angina pectoris: Secondary | ICD-10-CM | POA: Insufficient documentation

## 2016-07-25 DIAGNOSIS — Z79899 Other long term (current) drug therapy: Secondary | ICD-10-CM | POA: Diagnosis not present

## 2016-07-25 DIAGNOSIS — K8050 Calculus of bile duct without cholangitis or cholecystitis without obstruction: Secondary | ICD-10-CM | POA: Insufficient documentation

## 2016-07-25 DIAGNOSIS — R109 Unspecified abdominal pain: Secondary | ICD-10-CM

## 2016-07-25 DIAGNOSIS — E119 Type 2 diabetes mellitus without complications: Secondary | ICD-10-CM | POA: Diagnosis not present

## 2016-07-25 DIAGNOSIS — Z7984 Long term (current) use of oral hypoglycemic drugs: Secondary | ICD-10-CM | POA: Diagnosis not present

## 2016-07-25 DIAGNOSIS — R1011 Right upper quadrant pain: Secondary | ICD-10-CM | POA: Diagnosis present

## 2016-07-25 HISTORY — DX: Calculus of gallbladder without cholecystitis without obstruction: K80.20

## 2016-07-25 LAB — CBC WITH DIFFERENTIAL/PLATELET
BASOS ABS: 0 10*3/uL (ref 0–0.1)
Basophils Relative: 1 %
Eosinophils Absolute: 0.3 10*3/uL (ref 0–0.7)
Eosinophils Relative: 5 %
HEMATOCRIT: 39.3 % — AB (ref 40.0–52.0)
Hemoglobin: 13.7 g/dL (ref 13.0–18.0)
LYMPHS PCT: 27 %
Lymphs Abs: 1.5 10*3/uL (ref 1.0–3.6)
MCH: 28.8 pg (ref 26.0–34.0)
MCHC: 34.8 g/dL (ref 32.0–36.0)
MCV: 82.9 fL (ref 80.0–100.0)
Monocytes Absolute: 0.4 10*3/uL (ref 0.2–1.0)
Monocytes Relative: 7 %
NEUTROS ABS: 3.3 10*3/uL (ref 1.4–6.5)
Neutrophils Relative %: 60 %
Platelets: 157 10*3/uL (ref 150–440)
RBC: 4.74 MIL/uL (ref 4.40–5.90)
RDW: 13.4 % (ref 11.5–14.5)
WBC: 5.5 10*3/uL (ref 3.8–10.6)

## 2016-07-25 LAB — COMPREHENSIVE METABOLIC PANEL
ALT: 20 U/L (ref 17–63)
AST: 19 U/L (ref 15–41)
Albumin: 4.2 g/dL (ref 3.5–5.0)
Alkaline Phosphatase: 63 U/L (ref 38–126)
Anion gap: 7 (ref 5–15)
BILIRUBIN TOTAL: 0.9 mg/dL (ref 0.3–1.2)
BUN: 21 mg/dL — ABNORMAL HIGH (ref 6–20)
CHLORIDE: 106 mmol/L (ref 101–111)
CO2: 25 mmol/L (ref 22–32)
CREATININE: 0.91 mg/dL (ref 0.61–1.24)
Calcium: 8.7 mg/dL — ABNORMAL LOW (ref 8.9–10.3)
GFR calc Af Amer: >60 mL/min (ref >60)
Glucose, Bld: 146 mg/dL — ABNORMAL HIGH (ref 65–99)
Potassium: 3.7 mmol/L (ref 3.5–5.1)
Sodium: 138 mmol/L (ref 135–145)
TOTAL PROTEIN: 6.7 g/dL (ref 6.5–8.1)

## 2016-07-25 LAB — LIPASE, BLOOD: LIPASE: 21 U/L (ref 11–51)

## 2016-07-25 MED ORDER — ONDANSETRON 4 MG PO TBDP: 4.0000 mg | ORAL_TABLET | Freq: Three times a day (TID) | ORAL | 0 refills | Status: DC | PRN

## 2016-07-25 MED ORDER — OXYCODONE-ACETAMINOPHEN 5-325 MG PO TABS: 1.0000 | ORAL_TABLET | Freq: Four times a day (QID) | ORAL | 0 refills | Status: DC | PRN

## 2016-07-25 NOTE — ED Provider Notes (Signed)
[facility] Emergency Department Provider Note   ____________________________________________   First MD Initiated Contact with Patient 07/25/16 [phone]     (approximate)  I have reviewed the triage vital signs and the nursing notes.   HISTORY  Chief Complaint Abdominal Pain    HPI Jeremy Luna is a 61 y.o. male who comes into the hospital today with some abdominal pain. He's having some right upper quadrant/right lateral pain. He reports it woke him up at about 3:30 AM. He first having this pain on Sunday night. He states that got better after taking multiple ibuprofen doses. He reports that he eased off and then he went to the urgent care on Monday morning. The patient had blood work and a CT scan was told that he had gallstones causing the symptoms. He was told that if the pain should return he should come into the emergency department for further evaluation. He reports that he contacted his primary care physician and has an appointment on May 31 but this pain returned tonight. The patient didn't receive any pain medication. He reports that he hadn't had any pain until tonight. The patient has had some nausea with no fevers. He rates his pain a 3 out of 10 in intensity. He is here today for evaluation.   Past Medical History:  Diagnosis Date  . CAD (coronary artery disease)   . Diabetes mellitus without complication ([place])   . Dyslipidemia   . GERD (gastroesophageal reflux disease)   . H/O angioplasty   . Hypertension     There are no active problems to display for this patient.   Past Surgical History:  Procedure Laterality Date  . APPENDECTOMY    . colonscopy    . CORONARY ANGIOPLASTY WITH STENT PLACEMENT    . KNEE ARTHROSCOPY Left     Prior to Admission medications   Medication Sig Start Date End Date Taking? Authorizing Provider  aspirin 81 MG tablet Take 81 mg by mouth daily.    [provider]  atorvastatin (LIPITOR) 40 MG tablet  Take 40 mg by mouth daily.    [provider]  lisinopril (PRINIVIL,ZESTRIL) 20 MG tablet Take 20 mg by mouth daily.    [provider]  metFORMIN (GLUCOPHAGE) 500 MG tablet Take by mouth 2 (two) times daily with a meal.    [provider]  naproxen sodium (ANAPROX) 220 MG tablet Take 220 mg by mouth 2 (two) times daily with a meal.    [provider]  nitroGLYCERIN (NITROSTAT) 0.4 MG SL tablet Place 0.4 mg under the tongue every 5 (five) minutes as needed for chest pain.    [provider]  omeprazole (PRILOSEC) 20 MG capsule Take 20 mg by mouth daily.    [provider]    Allergies Patient has no known allergies.  No family history on file.  Social History Social History  Substance Use Topics  . Smoking status: Never Smoker  . Smokeless tobacco: Never Used  . Alcohol use Yes    Review of Systems  Constitutional: No fever/chills Eyes: No visual changes. ENT: No sore throat. Cardiovascular: Denies chest pain. Respiratory: Denies shortness of breath. Gastrointestinal:  abdominal pain.   nausea, no vomiting.  No diarrhea.  No constipation. Genitourinary: Negative for dysuria. Musculoskeletal: Negative for back pain. Skin: Negative for rash. Neurological: Negative for headaches, focal weakness or numbness.   ____________________________________________   PHYSICAL EXAM:  VITAL SIGNS: ED Triage Vitals  Enc Vitals Group  BP 07/25/16 0610 (!) 149/89     Pulse Rate 07/25/16 0610 64     Resp 07/25/16 0610 18     Temp 07/25/16 0610 97.8 F (36.6 C)     Temp Source 07/25/16 0610 Oral     SpO2 07/25/16 0610 97 %     Weight 07/25/16 0608 199 lb (90.3 kg)     Height 07/25/16 0608 5\' 11"  (1.803 m)     Head Circumference --      Peak Flow --      Pain Score 07/25/16 0608 3     Pain Loc --      Pain Edu? --      Excl. in [place]? --     Constitutional: Alert and oriented. Well appearing and in Mild distress. Eyes:  Conjunctivae are normal. PERRL. EOMI. Head: Atraumatic. Nose: No congestion/rhinnorhea. Mouth/Throat: Mucous membranes are moist.  Oropharynx non-erythematous. Cardiovascular: Normal rate, regular rhythm. Grossly normal heart sounds.  Good peripheral circulation. Respiratory: Normal respiratory effort.  No retractions. Lungs CTAB. Gastrointestinal: Soft with some right upper quadrant tenderness to palpation. No distention. Positive bowel sounds Musculoskeletal: No lower extremity tenderness nor edema.   Neurologic:  Normal speech and language.  Skin:  Skin is warm, dry and intact. Marland Kitchen Psychiatric: Mood and affect are normal.   ____________________________________________   LABS (all labs ordered are listed, but only abnormal results are displayed)  Labs Reviewed  CBC WITH DIFFERENTIAL/PLATELET  COMPREHENSIVE METABOLIC PANEL  LIPASE, BLOOD   ____________________________________________  EKG  none ____________________________________________  RADIOLOGY  [place] abd RUQ ____________________________________________   PROCEDURES  Procedure(s) performed: None  Procedures  Critical Care performed: No  ____________________________________________   INITIAL IMPRESSION / ASSESSMENT AND PLAN / ED COURSE  Pertinent labs & imaging results that were available during my care of the patient were reviewed by me and considered in my medical decision making (see chart for details).  This is a 61 year old male who comes into the hospital today with some right upper quadrant abdominal pain. The patient reports this started around 3:30 AM. It was worse but it has eased off some. The patient has also been nauseous. I will check some blood work and send the patient for an ultrasound looking at his gallbladder. The patient will be reassessed.     I will send the patient's care out to Dr. Joni Fears who is the oncoming physician. He will reassess the patient once the results are returned and  disposition the patient. I will like to have the patient evaluated by surgery since this is a second time in 3 days that he is being seen in the emergency Department for the same symptoms. ____________________________________________   FINAL CLINICAL IMPRESSION(S) / ED DIAGNOSES  Final diagnoses:  Abdominal pain      NEW MEDICATIONS STARTED DURING THIS VISIT:  New Prescriptions   No medications on file     Note:  This document was prepared using Dragon voice recognition software and may include unintentional dictation errors.    Loney Hering, MD 07/25/16 [phone]

## 2016-07-25 NOTE — Consult Note (Signed)
Date of Consultation:  07/25/2016  Requesting Physician:  [name], MD  Reason for Consultation:  Cholelithiasis  History of Present Illness: Jeremy Luna is a 61 y.o. male who presents with a one-day history of right upper quadrant abdominal pain. Patient reports that his pain started around 3:30 this morning in the right upper quadrant. He had mild nausea with that. The emergency room his pain has subsided. He had a similar episode on 5/20 at 10 PM which lasted for a couple of hours. On neither episode did he have any vomiting or fevers. The pain does not radiate.  He does not feel like this is associated with meals. During the first episode he took 3 ibuprofen tablets which helped with his pain. However this episode it did not help. After his first episode he presented to urgent care on 5/21 at which time a CT scan was obtained which showed cholelithiasis with no other acute pathology. On today's presentation to the emergency room, the patient had an ultrasound which showed cholelithiasis with no gallbladder wall thickening and no pericholecystic fluid. His laboratory workup in the emergency room has been unremarkable with normal LFTs and a normal white blood cell count. His pain has subsided and no longer has any nausea. Surgery has been consulted for further evaluation and potential outpatient management.  Of note, the patient does report that he has history of coronary artery disease with multiple stents placed with his last stent placed in 2013. He is currently on aspirin. He does report that he can walk a significant distances without any chest pain or shortness of breath and goes up and down stairs daily for his job without any symptoms.  Past Medical History: Past Medical History:  Diagnosis Date  . CAD (coronary artery disease)   . Diabetes mellitus without complication ([place])   . Dyslipidemia   . GERD (gastroesophageal reflux disease)   . H/O angioplasty   . Hypertension       Past Surgical History: Past Surgical History:  Procedure Laterality Date  . APPENDECTOMY    . colonscopy    . CORONARY ANGIOPLASTY WITH STENT PLACEMENT    . KNEE ARTHROSCOPY Left     Home Medications: Prior to Admission medications   Medication Sig Start Date End Date Taking? Authorizing Provider  aspirin 81 MG tablet Take 81 mg by mouth daily.   Yes [provider]  atorvastatin (LIPITOR) 40 MG tablet Take 40 mg by mouth daily.   Yes [provider]  lisinopril (PRINIVIL,ZESTRIL) 20 MG tablet Take 20 mg by mouth daily.   Yes [provider]  metFORMIN (GLUCOPHAGE) 500 MG tablet Take 500-1,000 mg by mouth 2 (two) times daily with a meal. Take 1000 mg by mouth in the morning and take 500 mg by mouth in the evening.   Yes [provider]  naproxen sodium (ANAPROX) 220 MG tablet Take 220 mg by mouth 2 (two) times daily with a meal.   Yes [provider]  nitroGLYCERIN (NITROSTAT) 0.4 MG SL tablet Place 0.4 mg under the tongue every 5 (five) minutes as needed for chest pain.   Yes [provider]  omeprazole (PRILOSEC) 20 MG capsule Take 20 mg by mouth daily.   Yes [provider]    Allergies: No Known Allergies  Social History:  reports that he has never smoked. He has never used smokeless tobacco. He reports that he drinks alcohol. He reports that he does not use drugs.   Family History:  History of DM, CAD and MI (father), and colon polyps (mother)  Review of Systems: Review of Systems  Constitutional: Negative for chills and fever.  HENT: Negative for hearing loss.   Eyes: Negative for blurred vision.  Respiratory: Negative for cough and shortness of breath.   Cardiovascular: Negative for chest pain and leg swelling.  Gastrointestinal: Positive for abdominal pain and nausea. Negative for blood in stool, constipation, diarrhea and vomiting.  Genitourinary: Negative for dysuria.  Musculoskeletal: Negative for  myalgias.  Skin: Negative for rash.  Neurological: Negative for dizziness.  Psychiatric/Behavioral: Negative for depression.  All other systems reviewed and are negative.   Physical Exam BP 119/77   Pulse (!) 59   Temp 97.8 F (36.6 C) (Oral)   Resp 17   Ht 5\' 11"  (1.803 m)   Wt 90.3 kg (199 lb)   SpO2 97%   BMI 27.75 kg/m  CONSTITUTIONAL: No acute distress HEENT:  Normocephalic, atraumatic, extraocular motion intact. NECK: Trachea is midline, and there is no jugular venous distension.  RESPIRATORY:  Lungs are clear, and breath sounds are equal bilaterally. Normal respiratory effort without pathologic use of accessory muscles. CARDIOVASCULAR: Heart is regular without murmurs, gallops, or rubs. GI: The abdomen is soft, nondistended, with no tenderness to palpation at this point.  Negative Murphy's sign. There were no palpable masses. MUSCULOSKELETAL:  Normal muscle strength and tone in all four extremities.  No peripheral edema or cyanosis. SKIN: Skin turgor is normal. There are no pathologic skin lesions.  NEUROLOGIC:  Motor and sensation is grossly normal.  Cranial nerves are grossly intact. PSYCH:  Alert and oriented to person, place and time. Affect is normal.  Laboratory Analysis: Results for orders placed or performed during the hospital encounter of 07/25/16 (from the past 24 hour(s))  CBC with Differential     Status: Abnormal   Collection Time: 07/25/16  6:31 AM  Result Value Ref Range   WBC 5.5 3.8 - 10.6 K/uL   RBC 4.74 4.40 - 5.90 MIL/uL   Hemoglobin 13.7 13.0 - 18.0 g/dL   HCT 39.3 (L) 40.0 - 52.0 %   MCV 82.9 80.0 - 100.0 fL   MCH 28.8 26.0 - 34.0 pg   MCHC 34.8 32.0 - 36.0 g/dL   RDW 13.4 11.5 - 14.5 %   Platelets 157 150 - 440 K/uL   Neutrophils Relative % 60 %   Neutro Abs 3.3 1.4 - 6.5 K/uL   Lymphocytes Relative 27 %   Lymphs Abs 1.5 1.0 - 3.6 K/uL   Monocytes Relative 7 %   Monocytes Absolute 0.4 0.2 - 1.0 K/uL   Eosinophils Relative 5 %    Eosinophils Absolute 0.3 0 - 0.7 K/uL   Basophils Relative 1 %   Basophils Absolute 0.0 0 - 0.1 K/uL  Comprehensive metabolic panel     Status: Abnormal   Collection Time: 07/25/16  6:31 AM  Result Value Ref Range   Sodium 138 135 - 145 mmol/L   Potassium 3.7 3.5 - 5.1 mmol/L   Chloride 106 101 - 111 mmol/L   CO2 25 22 - 32 mmol/L   Glucose, Bld 146 (H) 65 - 99 mg/dL   BUN 21 (H) 6 - 20 mg/dL   Creatinine, Ser 0.91 0.61 - 1.24 mg/dL   Calcium 8.7 (L) 8.9 - 10.3 mg/dL   Total Protein 6.7 6.5 - 8.1 g/dL   Albumin 4.2 3.5 - 5.0 g/dL   AST 19 15 - 41 U/L   ALT 20  17 - 63 U/L   Alkaline Phosphatase 63 38 - 126 U/L   Total Bilirubin 0.9 0.3 - 1.2 mg/dL   GFR calc non Af Amer >60 >60 mL/min   GFR calc Af Amer >60 >60 mL/min   Anion gap 7 5 - 15  Lipase, blood     Status: None   Collection Time: 07/25/16  6:31 AM  Result Value Ref Range   Lipase 21 11 - 51 U/L    Imaging: US Abdomen Limited Ruq  Result Date: 07/25/2016 CLINICAL DATA:  RIGHT upper quadrant pain for 3 days, history hypertension, diabetes mellitus, GERD, coronary artery disease EXAM: US ABDOMEN LIMITED - RIGHT UPPER QUADRANT COMPARISON:  CT abdomen and pelvis 07/23/2016 FINDINGS: Gallbladder: Nonshadowing echogenic intraluminal foci up to 6 mm diameter, mobile, consistent with small gallstones, also seen on prior CT. Additional non dependent intraluminal echogenic focus 3 mm diameter question gallbladder polyp. No gallbladder wall thickening, pericholecystic fluid or sonographic Murphy sign. Common bile duct: Diameter: 3 mm diameter , normal Liver: Cranial portion suboptimally visualized due to body habitus. Echogenicity grossly normal. No focal hepatic mass or nodularity. Hepatopetal portal venous flow. No RIGHT upper quadrant free fluid. IMPRESSION: Cholelithiasis. Probable 3 mm gallbladder polyp. Otherwise negative exam. Electronically Signed   By: Lavonia Dana M.D.   On: 07/25/2016 08:21    Assessment and Plan: This is a  61 y.o. male who presents with two episodes of biliary colic.  I have independently reviewed the patient's imaging studies from today and 5/21 as well as reviewed his laboratory studies from both visits. Overall patient has some cholelithiasis with no evidence of acute cholecystitis on either CT or ultrasound. He has diverticulosis but otherwise no evidence of diverticulitis. His LFTs are within normal limits, and his white blood cell count is normal as well.  Discussed with the patient the signs and symptoms of gallbladder pathology range in from biliary colic to acute cholecystitis. At this point the patient has had episodes of biliary colic only and there is no acute surgical need or urgent need for admission and operation at this point. After discussing further with the patient, we have elected to have him follow-up with me in the office as an outpatient next week. He has an appointment with Dr. Sabra Heck who is his PCP on 5/31 and we'll set up an appointment on that same day. Our hope would be that Dr. Sabra Heck could assess him for cardiac clearance for surgery. Then we will see him in the office and discuss further surgical plans for possible outpatient laparoscopic cholecystectomy. Instructions have also been given to the patient regarding any worsening symptoms including persistent and worsening pain in the right upper quadrant worsening nausea or vomiting fevers or chills to come to the emergency department once more for further evaluation. Otherwise will see him in the office on 5/31.   [name], [place]

## 2016-07-25 NOTE — ED Provider Notes (Signed)
 -----------------------------------------   9:12 AM on 07/25/2016 -----------------------------------------  Ultrasound shows cholelithiasis without evidence of cholecystitis or biliary obstruction. Patient reports pain is currently resolved. Abdomen is nontender. Case discussed with surgery for consult for further follow-up planning.  ----------------------------------------- 11:11 AM on 07/25/2016 -----------------------------------------  Discussed with Dr. Hampton Abbot after his evaluation of the patient. Agrees with outpatient follow-up. I'll provide the patient was Zofran and Percocet as needed for recurrent pain. Has scheduled appointment May 31.  Final diagnoses:  Abdominal pain  Biliary colic      [name], MD 07/25/16 1112

## 2016-07-25 NOTE — ED Triage Notes (Addendum)
Patient ambulatory to triage with steady gait, without difficulty or distress noted; pt reports right sided pain since 3am, hx of gallstones, sched for EGD in July; denies any accomp symptoms; st took motrin PTA with much relief; seen at [facility] 5/21, results in chart

## 2016-07-27 ENCOUNTER — Ambulatory Visit: Payer: Self-pay | Admitting: General Surgery

## 2016-07-31 ENCOUNTER — Other Ambulatory Visit: Payer: Self-pay

## 2016-08-02 ENCOUNTER — Encounter: Payer: Self-pay | Admitting: Surgery

## 2016-08-02 ENCOUNTER — Ambulatory Visit (INDEPENDENT_AMBULATORY_CARE_PROVIDER_SITE_OTHER): Payer: BLUE CROSS/BLUE SHIELD | Admitting: Surgery

## 2016-08-02 VITALS — BP 120/77 | HR 67 | Temp 98.1°F | Ht 71.0 in | Wt 203.0 lb

## 2016-08-02 DIAGNOSIS — K8020 Calculus of gallbladder without cholecystitis without obstruction: Secondary | ICD-10-CM | POA: Diagnosis not present

## 2016-08-02 NOTE — Patient Instructions (Addendum)
We have scheduled your Lapraoscopic Cholecystectomy on 08/29/16 with Dr. Hampton Abbot.  Please see your blue surgery sheet for further instructions.

## 2016-08-02 NOTE — Progress Notes (Signed)
08/02/2016  History of Present Illness: Jeremy Luna is a 61 y.o. male seen in the ED last week with a second episode of biliary colic.  He presents today for further discussion about possible surgical options. He did see his PCP today as well and was cleared from the cardiac standpoint for surgery. He reports that since his visit to the emergency department he has not had any significant pain except for a couple episodes of very minor discomfort in the right upper quadrant. Otherwise denies any nausea, vomiting, fevers, chills, chest pain, shortness of breath. He reports that he has been able to eat well and is following a low-fat diet as instructed during his visit to the emergency room. He has not required any pain medication or nausea medication.  Past Medical History: Past Medical History:  Diagnosis Date  . CAD (coronary artery disease)   . Cholelithiasis   . Diabetes mellitus without complication ([place])   . Dyslipidemia   . GERD (gastroesophageal reflux disease)   . H/O angioplasty   . Hypertension      Past Surgical History: Past Surgical History:  Procedure Laterality Date  . APPENDECTOMY    . colonscopy    . CORONARY ANGIOPLASTY WITH STENT PLACEMENT    . KNEE ARTHROSCOPY Left     Home Medications: Prior to Admission medications   Medication Sig Start Date End Date Taking? Authorizing Provider  aspirin 81 MG tablet Take 81 mg by mouth daily.   Yes [provider]  atorvastatin (LIPITOR) 40 MG tablet Take 1 tablet by mouth once. 08/02/16 08/02/17 Yes [provider]  lisinopril (PRINIVIL,ZESTRIL) 20 MG tablet Take 1 tablet by mouth once. 08/02/16  Yes [provider]  metFORMIN (GLUCOPHAGE) 500 MG tablet Take 500-1,000 mg by mouth 2 (two) times daily with a meal. Take 1000 mg by mouth in the morning and take 500 mg by mouth in the evening.   Yes [provider]  omeprazole (PRILOSEC) 20 MG capsule Take 20 mg by mouth daily.   Yes [provider]  naproxen sodium (ANAPROX) 220 MG tablet Take 220 mg by mouth 2 (two) times daily with a meal.    [provider]  nitroGLYCERIN (NITROSTAT) 0.4 MG SL tablet Place 0.4 mg under the tongue every 5 (five) minutes as needed for chest pain.    [provider]  ondansetron (ZOFRAN ODT) 4 MG disintegrating tablet Take 1 tablet (4 mg total) by mouth every 8 (eight) hours as needed for nausea or vomiting. Patient not taking: Reported on 08/02/2016 07/25/16   [name], MD    Allergies: No Known Allergies  Review of Systems: Review of Systems  Constitutional: Negative for chills and fever.  HENT: Negative for hearing loss.   Eyes: Negative for blurred vision.  Respiratory: Negative for cough.   Cardiovascular: Negative for chest pain.  Gastrointestinal: Negative for abdominal pain, nausea and vomiting.  Genitourinary: Negative for dysuria.  Musculoskeletal: Negative for myalgias.  Skin: Negative for rash.  Neurological: Negative for dizziness.  Psychiatric/Behavioral: Negative for depression.  All other systems reviewed and are negative.   Physical Exam BP 120/77   Pulse 67   Temp 98.1 F (36.7 C) (Oral)   Ht 5\' 11"  (1.803 m)   Wt 92.1 kg (203 lb)   BMI 28.31 kg/m  CONSTITUTIONAL: No acute distress HEENT:  Normocephalic, atraumatic, extraocular motion intact. NECK: Trachea is midline, and there is no jugular venous distension.  RESPIRATORY:  Lungs are clear, and  breath sounds are equal bilaterally. Normal respiratory effort without pathologic use of accessory muscles. CARDIOVASCULAR: Heart is regular without murmurs, gallops, or rubs. GI: The abdomen is soft, nondistended, nontender to palpation. Negative Murphy's sign. There were no palpable masses.  MUSCULOSKELETAL:  Normal muscle strength and tone in all four extremities.  No peripheral edema or cyanosis. SKIN: Skin turgor is normal. There are no pathologic skin lesions.  NEUROLOGIC:  Motor  and sensation is grossly normal.  Cranial nerves are grossly intact. PSYCH:  Alert and oriented to person, place and time. Affect is normal.  Labs/Imaging: Ultrasound from 5/23 revealed small gallstones with no evidence of acute cholecystitis including no gallbladder wall thickening or pericholecystic fluid and a negative Murphy's sign. His labs were also normal with a white blood cell count of 5.5 and normal LFTs.  Assessment and Plan: This is a 61 y.o. male who presents with history of biliary colic now having had 2 episodes this month.  Discussed with the patient regarding surgical management for biliary colic. Plan would be for a laparoscopic cholecystectomy with the possibility of converting to open. Discussed with the patient the risks and benefits of the procedure including risk of bleeding, infection, and injury to surrounding structures. Discussed with the patient the postoperative outcomes and expectations. He does understand that he will be limited to no heavy lifting of more than 10-15 pounds for a period of 4 weeks. He also understands that there is an outpatient procedure although is converted to open may have to stay in the hospital for a couple of days for pain control. Currently the patient will be scheduled for June 27. He will stop his aspirin 3 days before that. Patient understands this plan and all of his questions have been answered.   [name], [place]

## 2016-08-03 ENCOUNTER — Telehealth: Payer: Self-pay | Admitting: Surgery

## 2016-08-03 NOTE — Telephone Encounter (Signed)
Left message on patient VM to call back to be advised of pre op date/time and sx date. Sx: 08/29/16 with Dr Piscoya-laparoscopic cholecystectomy.  Pre op: 08/20/16 @ 9:00am--Office.   Patient made aware to call [phone], between 1-3:00pm the day before surgery, to find out what time to arrive.     Patient will need to make a payment on deposit for physician charges.  $5366.44

## 2016-08-06 NOTE — Telephone Encounter (Signed)
Patient has called back and been notified of surgery information.

## 2016-08-20 ENCOUNTER — Encounter
Admission: RE | Admit: 2016-08-20 | Discharge: 2016-08-20 | Disposition: A | Discharge status: Home / Self Care | Payer: BLUE CROSS/BLUE SHIELD | Source: Ambulatory Visit | Attending: Surgery | Admitting: Surgery

## 2016-08-20 DIAGNOSIS — Z01818 Encounter for other preprocedural examination: Secondary | ICD-10-CM | POA: Diagnosis not present

## 2016-08-20 HISTORY — DX: Nausea with vomiting, unspecified: R11.2

## 2016-08-20 HISTORY — DX: Other specified postprocedural states: Z98.890

## 2016-08-20 NOTE — Pre-Procedure Instructions (Signed)
During PAT visit pt reports after anesthesia he experiences nausea and vomiting, he has received additional medication thru his IV that helps some.  We discussed the scopalamine patch, pt tis not interested in using it.  Pt reports he has a loose upper front tooth, instructed pt to let anesthesia know the am of surgery.  Let him know that the tooth may come out during anesthesia.   EKG done at Dr. Myrtis Ser office:   ECG 12-lead5/31/2018 [place] Component Name Value Ref Range  Vent Rate (bpm) 64   PR Interval (msec) 144   QRS Interval (msec) 88   QT Interval (msec) 406   QTc (msec) 418   Result Narrative  Normal sinus rhythm Normal ECG When compared with ECG of 03-Jul-2013 03:55, No significant change was found I reviewed and concur with this report. Electronically signed NO:[facility] MD, [place] ([id number]) on 08/07/2016 1:16:44 PM

## 2016-08-20 NOTE — Patient Instructions (Signed)
  Your procedure is scheduled on:Wednesday August 29, 2016. Report to Same Day Surgery. To find out your arrival time please call [phone] between 1PM - 3PM on August 28, 2016 .  Remember: Instructions that are not followed completely may result in serious medical risk, up to and including death, or upon the discretion of your surgeon and anesthesiologist your surgery may need to be rescheduled.    _x___ 1. Do not eat food or drink liquids after midnight. No gum chewing or hard candies.     _x___ 2. No Alcohol for 24 hours before or after surgery.   ____ 3. Bring all medications with you on the day of surgery if instructed.    __x__ 4. Notify your doctor if there is any change in your medical condition     (cold, fever, infections).    _____ 5. No smoking 24 hours prior to surgery.     Do not wear jewelry, make-up, hairpins, clips or nail polish.  Do not wear lotions, powders, or perfumes.   Do not shave 48 hours prior to surgery. Men may shave face and neck.  Do not bring valuables to the hospital.    [facility] is not responsible for any belongings or valuables.               Contacts, dentures or bridgework may not be worn into surgery.  Leave your suitcase in the car. After surgery it may be brought to your room.  For patients admitted to the hospital, discharge time is determined by your treatment team.   Patients discharged the day of surgery will not be allowed to drive home.    Please read over the following fact sheets that you were given:   [facility] Preparing for Surgery  __x__ Take these medicines the morning of surgery with A SIP OF WATER:    1. atorvastatin (LIPITOR)   2. lisinopril (PRINIVIL,ZESTRIL)   3. omeprazole (PRILOSEC) and at bedtime the night before surgery.   ____ Fleet Enema (as directed)   _x___ Use CHG Soap as directed on instruction sheet  ____ Use inhalers on the day of surgery and bring to hospital day of surgery  _x___ Stop metformin 2  days prior to surgery    ____ Take 1/2 of usual insulin dose the night before surgery and none on the morning of surgery.   _x___ Stop aspirin as directed by your surgeon.  __x__ Stop Anti-inflammatories such as Advil, Aleve, Ibuprofen, Motrin, Naproxen, Naprosyn, Goodies powders or aspirin  products. OK to take Tylenol.   ____ Stop supplements until after surgery.    ____ Bring C-Pap to the hospital.

## 2016-08-29 ENCOUNTER — Ambulatory Visit: Payer: BLUE CROSS/BLUE SHIELD | Admitting: Certified Registered Nurse Anesthetist

## 2016-08-29 ENCOUNTER — Encounter
Admission: RE | Disposition: A | Discharge status: Home / Self Care | Payer: Self-pay | Source: Ambulatory Visit | Attending: Surgery

## 2016-08-29 ENCOUNTER — Encounter: Payer: Self-pay | Admitting: Certified Registered Nurse Anesthetist

## 2016-08-29 ENCOUNTER — Ambulatory Visit
Admission: RE | Admit: 2016-08-29 | Discharge: 2016-08-29 | Disposition: A | Discharge status: Home / Self Care | Payer: BLUE CROSS/BLUE SHIELD | Source: Ambulatory Visit | Attending: Surgery | Admitting: Surgery

## 2016-08-29 DIAGNOSIS — Z955 Presence of coronary angioplasty implant and graft: Secondary | ICD-10-CM | POA: Insufficient documentation

## 2016-08-29 DIAGNOSIS — K8020 Calculus of gallbladder without cholecystitis without obstruction: Secondary | ICD-10-CM

## 2016-08-29 DIAGNOSIS — K8064 Calculus of gallbladder and bile duct with chronic cholecystitis without obstruction: Secondary | ICD-10-CM | POA: Insufficient documentation

## 2016-08-29 DIAGNOSIS — E119 Type 2 diabetes mellitus without complications: Secondary | ICD-10-CM | POA: Diagnosis not present

## 2016-08-29 DIAGNOSIS — I2510 Atherosclerotic heart disease of native coronary artery without angina pectoris: Secondary | ICD-10-CM | POA: Insufficient documentation

## 2016-08-29 DIAGNOSIS — K219 Gastro-esophageal reflux disease without esophagitis: Secondary | ICD-10-CM | POA: Diagnosis not present

## 2016-08-29 DIAGNOSIS — E785 Hyperlipidemia, unspecified: Secondary | ICD-10-CM | POA: Insufficient documentation

## 2016-08-29 DIAGNOSIS — Z7982 Long term (current) use of aspirin: Secondary | ICD-10-CM | POA: Insufficient documentation

## 2016-08-29 DIAGNOSIS — Z79899 Other long term (current) drug therapy: Secondary | ICD-10-CM | POA: Insufficient documentation

## 2016-08-29 DIAGNOSIS — I10 Essential (primary) hypertension: Secondary | ICD-10-CM | POA: Diagnosis not present

## 2016-08-29 DIAGNOSIS — Z7984 Long term (current) use of oral hypoglycemic drugs: Secondary | ICD-10-CM | POA: Insufficient documentation

## 2016-08-29 DIAGNOSIS — K811 Chronic cholecystitis: Secondary | ICD-10-CM | POA: Diagnosis present

## 2016-08-29 HISTORY — PX: CHOLECYSTECTOMY: SHX55

## 2016-08-29 LAB — GLUCOSE, CAPILLARY
GLUCOSE-CAPILLARY: 179 mg/dL — AB (ref 65–99)
Glucose-Capillary: 152 mg/dL — ABNORMAL HIGH (ref 65–99)

## 2016-08-29 SURGERY — LAPAROSCOPIC CHOLECYSTECTOMY
Anesthesia: General

## 2016-08-29 MED ORDER — OXYCODONE HCL 5 MG PO TABS: 5.0000 mg | ORAL_TABLET | Freq: Four times a day (QID) | ORAL | 0 refills | Status: DC | PRN

## 2016-08-29 MED ORDER — EPHEDRINE SULFATE 50 MG/ML IJ SOLN
INTRAMUSCULAR | Status: AC
  Filled 2016-08-29: qty 1

## 2016-08-29 MED ORDER — SUGAMMADEX SODIUM 500 MG/5ML IV SOLN
INTRAVENOUS | Status: DC | PRN
  Administered 2016-08-29: 200 mg via INTRAVENOUS

## 2016-08-29 MED ORDER — MIDAZOLAM HCL 2 MG/2ML IJ SOLN
INTRAMUSCULAR | Status: AC
  Filled 2016-08-29: qty 2

## 2016-08-29 MED ORDER — ONDANSETRON HCL 4 MG/2ML IJ SOLN
INTRAMUSCULAR | Status: AC
  Filled 2016-08-29: qty 2

## 2016-08-29 MED ORDER — PROMETHAZINE HCL 25 MG/ML IJ SOLN: 6.2500 mg | INTRAMUSCULAR | Status: DC | PRN

## 2016-08-29 MED ORDER — ROCURONIUM BROMIDE 50 MG/5ML IV SOLN
INTRAVENOUS | Status: AC
  Filled 2016-08-29: qty 1

## 2016-08-29 MED ORDER — PHENYLEPHRINE HCL 10 MG/ML IJ SOLN
INTRAMUSCULAR | Status: DC | PRN
  Administered 2016-08-29 (×2): 100 ug via INTRAVENOUS

## 2016-08-29 MED ORDER — LIDOCAINE HCL (PF) 2 % IJ SOLN
INTRAMUSCULAR | Status: AC
  Filled 2016-08-29: qty 2

## 2016-08-29 MED ORDER — ROCURONIUM BROMIDE 100 MG/10ML IV SOLN
INTRAVENOUS | Status: DC | PRN
  Administered 2016-08-29: 50 mg via INTRAVENOUS

## 2016-08-29 MED ORDER — GLYCOPYRROLATE 0.2 MG/ML IJ SOLN
INTRAMUSCULAR | Status: AC
  Filled 2016-08-29: qty 1

## 2016-08-29 MED ORDER — MIDAZOLAM HCL 2 MG/2ML IJ SOLN
INTRAMUSCULAR | Status: DC | PRN
  Administered 2016-08-29: 2 mg via INTRAVENOUS

## 2016-08-29 MED ORDER — FENTANYL CITRATE (PF) 250 MCG/5ML IJ SOLN
INTRAMUSCULAR | Status: AC
  Filled 2016-08-29: qty 5

## 2016-08-29 MED ORDER — MEPERIDINE HCL 50 MG/ML IJ SOLN: 6.2500 mg | INTRAMUSCULAR | Status: DC | PRN

## 2016-08-29 MED ORDER — SODIUM CHLORIDE 0.9 % IV SOLN
INTRAVENOUS | Status: DC
  Administered 2016-08-29: 10:00:00 via INTRAVENOUS

## 2016-08-29 MED ORDER — ACETAMINOPHEN 500 MG PO TABS
1000.0000 mg | ORAL_TABLET | ORAL | Status: AC
  Administered 2016-08-29: 1000 mg via ORAL

## 2016-08-29 MED ORDER — OXYCODONE HCL 5 MG PO TABS: 5.0000 mg | ORAL_TABLET | Freq: Once | ORAL | Status: DC | PRN

## 2016-08-29 MED ORDER — BUPIVACAINE-EPINEPHRINE 0.5% -1:200000 IJ SOLN
INTRAMUSCULAR | Status: DC | PRN
  Administered 2016-08-29: 30 mL

## 2016-08-29 MED ORDER — KETOROLAC TROMETHAMINE 30 MG/ML IJ SOLN
INTRAMUSCULAR | Status: DC | PRN
  Administered 2016-08-29: 30 mg via INTRAVENOUS

## 2016-08-29 MED ORDER — FENTANYL CITRATE (PF) 100 MCG/2ML IJ SOLN: 25.0000 ug | INTRAMUSCULAR | Status: DC | PRN

## 2016-08-29 MED ORDER — SEVOFLURANE IN SOLN
RESPIRATORY_TRACT | Status: AC
  Filled 2016-08-29: qty 250

## 2016-08-29 MED ORDER — OXYCODONE HCL 5 MG/5ML PO SOLN: 5.0000 mg | Freq: Once | ORAL | Status: DC | PRN

## 2016-08-29 MED ORDER — DEXAMETHASONE SODIUM PHOSPHATE 10 MG/ML IJ SOLN
INTRAMUSCULAR | Status: AC
  Filled 2016-08-29: qty 1

## 2016-08-29 MED ORDER — GABAPENTIN 300 MG PO CAPS
ORAL_CAPSULE | ORAL | Status: AC
  Administered 2016-08-29: 300 mg via ORAL
  Filled 2016-08-29: qty 1

## 2016-08-29 MED ORDER — SUGAMMADEX SODIUM 200 MG/2ML IV SOLN
INTRAVENOUS | Status: AC
  Filled 2016-08-29: qty 2

## 2016-08-29 MED ORDER — ONDANSETRON HCL 4 MG/2ML IJ SOLN
INTRAMUSCULAR | Status: DC | PRN
  Administered 2016-08-29: 4 mg via INTRAVENOUS

## 2016-08-29 MED ORDER — GLYCOPYRROLATE 0.2 MG/ML IJ SOLN
INTRAMUSCULAR | Status: DC | PRN
  Administered 2016-08-29: 0.2 mg via INTRAVENOUS

## 2016-08-29 MED ORDER — ACETAMINOPHEN 500 MG PO TABS
ORAL_TABLET | ORAL | Status: AC
  Administered 2016-08-29: 1000 mg via ORAL
  Filled 2016-08-29: qty 2

## 2016-08-29 MED ORDER — EPHEDRINE SULFATE 50 MG/ML IJ SOLN
INTRAMUSCULAR | Status: DC | PRN
  Administered 2016-08-29: 5 mg via INTRAVENOUS

## 2016-08-29 MED ORDER — CEFAZOLIN SODIUM-DEXTROSE 2-4 GM/100ML-% IV SOLN
INTRAVENOUS | Status: AC
  Filled 2016-08-29: qty 100

## 2016-08-29 MED ORDER — SODIUM CHLORIDE 0.9 % IV SOLN
INTRAVENOUS | Status: DC
  Administered 2016-08-29 (×2): via INTRAVENOUS

## 2016-08-29 MED ORDER — LIDOCAINE HCL (CARDIAC) 20 MG/ML IV SOLN
INTRAVENOUS | Status: DC | PRN
  Administered 2016-08-29: 100 mg via INTRAVENOUS

## 2016-08-29 MED ORDER — DEXAMETHASONE SODIUM PHOSPHATE 10 MG/ML IJ SOLN
INTRAMUSCULAR | Status: DC | PRN
  Administered 2016-08-29: 8 mg via INTRAVENOUS

## 2016-08-29 MED ORDER — FENTANYL CITRATE (PF) 100 MCG/2ML IJ SOLN
INTRAMUSCULAR | Status: DC | PRN
  Administered 2016-08-29 (×3): 50 ug via INTRAVENOUS

## 2016-08-29 MED ORDER — CHLORHEXIDINE GLUCONATE CLOTH 2 % EX PADS: 6.0000 | MEDICATED_PAD | Freq: Once | CUTANEOUS | Status: DC

## 2016-08-29 MED ORDER — BUPIVACAINE-EPINEPHRINE (PF) 0.25% -1:200000 IJ SOLN
INTRAMUSCULAR | Status: AC
  Filled 2016-08-29: qty 30

## 2016-08-29 MED ORDER — PROPOFOL 10 MG/ML IV BOLUS
INTRAVENOUS | Status: DC | PRN
  Administered 2016-08-29: 170 mg via INTRAVENOUS

## 2016-08-29 MED ORDER — CHLORHEXIDINE GLUCONATE CLOTH 2 % EX PADS
6.0000 | MEDICATED_PAD | Freq: Once | CUTANEOUS | Status: AC
  Administered 2016-08-28: 6 via TOPICAL

## 2016-08-29 MED ORDER — GABAPENTIN 300 MG PO CAPS
300.0000 mg | ORAL_CAPSULE | ORAL | Status: AC
  Administered 2016-08-29: 300 mg via ORAL

## 2016-08-29 MED ORDER — CEFAZOLIN SODIUM-DEXTROSE 2-4 GM/100ML-% IV SOLN
2.0000 g | INTRAVENOUS | Status: AC
  Administered 2016-08-29: 2 g via INTRAVENOUS

## 2016-08-29 MED ORDER — KETOROLAC TROMETHAMINE 30 MG/ML IJ SOLN
INTRAMUSCULAR | Status: AC
  Filled 2016-08-29: qty 1

## 2016-08-29 MED ORDER — PROPOFOL 10 MG/ML IV BOLUS
INTRAVENOUS | Status: AC
  Filled 2016-08-29: qty 20

## 2016-08-29 SURGICAL SUPPLY — 43 items
APPLIER CLIP 5 13 M/L LIGAMAX5 (MISCELLANEOUS) ×3
BLADE SURG 15 STRL LF DISP TIS (BLADE) ×1 IMPLANT
BLADE SURG 15 STRL SS (BLADE) ×2
CANISTER SUCT 1200ML W/VALVE (MISCELLANEOUS) ×3 IMPLANT
CATH CHOLANGI 4FR 420404F (CATHETERS) IMPLANT
CHLORAPREP W/TINT 26ML (MISCELLANEOUS) ×3 IMPLANT
CLIP APPLIE 5 13 M/L LIGAMAX5 (MISCELLANEOUS) ×1 IMPLANT
CONRAY 60ML FOR OR (MISCELLANEOUS) ×3 IMPLANT
DERMABOND ADVANCED (GAUZE/BANDAGES/DRESSINGS) ×2
DERMABOND ADVANCED .7 DNX12 (GAUZE/BANDAGES/DRESSINGS) ×1 IMPLANT
DRAPE C-ARM XRAY 36X54 (DRAPES) IMPLANT
ELECT REM PT RETURN 9FT ADLT (ELECTROSURGICAL) ×3
ELECTRODE REM PT RTRN 9FT ADLT (ELECTROSURGICAL) ×1 IMPLANT
ENDOPOUCH RETRIEVER 10 (MISCELLANEOUS) ×3 IMPLANT
FILTER LAP SMOKE EVAC STRL (MISCELLANEOUS) ×3 IMPLANT
GLOVE SURG SYN 7.0 (GLOVE) ×3 IMPLANT
GLOVE SURG SYN 7.5  E (GLOVE) ×2
GLOVE SURG SYN 7.5 E (GLOVE) ×1 IMPLANT
GOWN STRL REUS W/ TWL LRG LVL3 (GOWN DISPOSABLE) ×3 IMPLANT
GOWN STRL REUS W/TWL LRG LVL3 (GOWN DISPOSABLE) ×6
IRRIGATION STRYKERFLOW (MISCELLANEOUS) IMPLANT
IRRIGATOR STRYKERFLOW (MISCELLANEOUS)
IV CATH ANGIO 12GX3 LT BLUE (NEEDLE) ×3 IMPLANT
IV NS 1000ML (IV SOLUTION) ×2
IV NS 1000ML BAXH (IV SOLUTION) ×1 IMPLANT
JACKSON PRATT 10 (INSTRUMENTS) IMPLANT
L-HOOK LAP DISP 36CM (ELECTROSURGICAL) ×3
LABEL OR SOLS (LABEL) ×3 IMPLANT
LHOOK LAP DISP 36CM (ELECTROSURGICAL) ×1 IMPLANT
NDL SAFETY 22GX1.5 (NEEDLE) ×3 IMPLANT
NEEDLE HYPO 22GX1.5 SAFETY (NEEDLE) ×3 IMPLANT
PACK LAP CHOLECYSTECTOMY (MISCELLANEOUS) ×3 IMPLANT
PENCIL ELECTRO HAND CTR (MISCELLANEOUS) ×3 IMPLANT
SCISSORS METZENBAUM CVD 33 (INSTRUMENTS) ×3 IMPLANT
SLEEVE ADV FIXATION 5X100MM (TROCAR) ×9 IMPLANT
SPONGE VERSALON 4X4 4PLY (MISCELLANEOUS) IMPLANT
SUT MNCRL 4-0 (SUTURE) ×2
SUT MNCRL 4-0 27XMFL (SUTURE) ×1
SUT VICRYL 0 AB UR-6 (SUTURE) ×3 IMPLANT
SUTURE MNCRL 4-0 27XMF (SUTURE) ×1 IMPLANT
TROCAR 130MM GELPORT  DAV (MISCELLANEOUS) ×3 IMPLANT
TROCAR Z-THREAD OPTICAL 5X100M (TROCAR) ×3 IMPLANT
TUBING INSUFFLATOR HI FLOW (MISCELLANEOUS) ×3 IMPLANT

## 2016-08-29 NOTE — Op Note (Signed)
  Procedure Date:  08/29/2016  Pre-operative Diagnosis:  Symptomatic cholelithiasis  Post-operative Diagnosis:  Symptomatic cholelithiasis  Procedure:  Laparoscopic cholecystectomy  Surgeon:  [name], MD  Anesthesia:  General endotracheal  Estimated Blood Loss:  10 ml  Specimens:  gallbladder  Complications:  None  Indications for Procedure:  This is a 61 y.o. male who presents with abdominal pain and workup revealing symptomatic cholelithiasis.  The benefits, complications, treatment options, and expected outcomes were discussed with the patient. The risks of bleeding, infection, recurrence of symptoms, failure to resolve symptoms, bile duct damage, bile duct leak, retained common bile duct stone, bowel injury, and need for further procedures were all discussed with the patient and he was willing to proceed.  Description of Procedure: The patient was correctly identified in the preoperative area and brought into the operating room.  The patient was placed supine with VTE prophylaxis in place.  Appropriate time-outs were performed.  Anesthesia was induced and the patient was intubated.  Appropriate antibiotics were infused.  The abdomen was prepped and draped in a sterile fashion. An infraumbilical incision was made. A cutdown technique was used to enter the abdominal cavity without injury, and a Hasson trocar was inserted.  Pneumoperitoneum was obtained with appropriate opening pressures.  A 5-mm port was placed in the subxiphoid area and two 5-mm ports were placed in the right upper quadrant under direct visualization.  The gallbladder was identified.  The fundus was grasped and retracted cephalad.  Adhesions were lysed bluntly and with electrocautery. The infundibulum was grasped and retracted laterally, exposing the peritoneum overlying the gallbladder.  This was incised with electrocautery and extended on either side of the gallbladder.  The cystic duct and cystic artery were  clearly identified and bluntly dissected.  Both were clipped twice proximally and once distally, cutting in between.  The gallbladder was taken from the gallbladder fossa in a retrograde fashion with electrocautery. There was minimal bile spillage during this. The gallbladder was placed in an Endocatch bag and brought out via the umbilical incision. The liver bed was inspected and any bleeding was controlled with electrocautery. The right upper quadrant was then inspected again revealing intact clips, no bleeding, and no ductal injury.  The area was thoroughly irrigated.  The 5 mm ports were removed under direct visualization and the Hasson trocar was removed.  The fascial opening was closed using 0 vicryl suture.  Local anesthetic was infused in all incisions and the incisions were closed with 4-0 Monocryl.  The wounds were cleaned and sealed with DermaBond.  The patient was emerged from anesthesia and extubated and brought to the recovery room for further management.  The patient tolerated the procedure well and all counts were correct at the end of the case.   [name], MD

## 2016-08-29 NOTE — Interval H&P Note (Signed)
History and Physical Interval Note:  08/29/2016 9:35 AM  Jeremy Luna  has presented today for surgery, with the diagnosis of biliary colic  The various methods of treatment have been discussed with the patient and family. After consideration of risks, benefits and other options for treatment, the patient has consented to  Procedure(s): LAPAROSCOPIC CHOLECYSTECTOMY (N/A) as a surgical intervention .  The patient's history has been reviewed, patient examined, no change in status, stable for surgery.  I have reviewed the patient's chart and labs.  Questions were answered to the patient's satisfaction.     Gentry Seeber

## 2016-08-29 NOTE — Discharge Instructions (Signed)
Laparoscopic Cholecystectomy, Care After This sheet gives you information about how to care for yourself after your procedure. Your doctor may also give you more specific instructions. If you have problems or questions, contact your doctor. Follow these instructions at home: Care for cuts from surgery (incisions)   Follow instructions from your doctor about how to take care of your cuts from surgery. Make sure you: ? Wash your hands with soap and water before you change your bandage (dressing). If you cannot use soap and water, use hand sanitizer. ? Change your bandage as told by your doctor. ? Leave stitches (sutures), skin glue, or skin tape (adhesive) strips in place. They may need to stay in place for 2 weeks or longer. If tape strips get loose and curl up, you may trim the loose edges. Do not remove tape strips completely unless your doctor says it is okay.  Do not take baths, swim, or use a hot tub until your doctor says it is okay. Ask your doctor if you can take showers. You may only be allowed to take sponge baths for bathing.  Check your surgical cut area every day for signs of infection. Check for: ? More redness, swelling, or pain. ? More fluid or blood. ? Warmth. ? Pus or a bad smell. Activity  Do not drive or use heavy machinery while taking prescription pain medicine.  Do not lift anything that is heavier than 10 lb (4.5 kg) until your doctor says it is okay.  Do not play contact sports until your doctor says it is okay.  Do not drive for 24 hours if you were given a medicine to help you relax (sedative).  Rest as needed. Do not return to work or school until your doctor says it is okay. General instructions  Take over-the-counter and prescription medicines only as told by your doctor.  To prevent or treat constipation while you are taking prescription pain medicine, your doctor may recommend that you: ? Drink enough fluid to keep your pee (urine) clear or pale  yellow. ? Take over-the-counter or prescription medicines. ? Eat foods that are high in fiber, such as fresh fruits and vegetables, whole grains, and beans. ? Limit foods that are high in fat and processed sugars, such as fried and sweet foods. Contact a doctor if:  You develop a rash.  You have more redness, swelling, or pain around your surgical cuts.  You have more fluid or blood coming from your surgical cuts.  Your surgical cuts feel warm to the touch.  You have pus or a bad smell coming from your surgical cuts.  You have a fever.  One or more of your surgical cuts breaks open. Get help right away if:  You have trouble breathing.  You have chest pain.  You have pain that is getting worse in your shoulders.  You faint or feel dizzy when you stand.  You have very bad pain in your belly (abdomen).  You are sick to your stomach (nauseous) for more than one day.  You have throwing up (vomiting) that lasts for more than one day.  You have leg pain. This information is not intended to replace advice given to you by your health care provider. Make sure you discuss any questions you have with your health care provider. Document Released: 11/29/2007 Document Revised: 09/10/2015 Document Reviewed: 08/08/2015 Elsevier Interactive Patient Education  2017 [place] Anesthesia, Adult, Care After These instructions provide you with information about caring for  yourself after your procedure. Your health care provider may also give you more specific instructions. Your treatment has been planned according to current medical practices, but problems sometimes occur. Call your health care provider if you have any problems or questions after your procedure. What can I expect after the procedure? After the procedure, it is common to have:  Vomiting.  A sore throat.  Mental slowness.  It is common to feel:  Nauseous.  Cold or shivery.  Sleepy.  Tired.  Sore or  achy, even in parts of your body where you did not have surgery.  Follow these instructions at home: For at least 24 hours after the procedure:  Do not: ? Participate in activities where you could fall or become injured. ? Drive. ? Use heavy machinery. ? Drink alcohol. ? Take sleeping pills or medicines that cause drowsiness. ? Make important decisions or sign legal documents. ? Take care of children on your own.  Rest. Eating and drinking  If you vomit, drink water, juice, or soup when you can drink without vomiting.  Drink enough fluid to keep your urine clear or pale yellow.  Make sure you have little or no nausea before eating solid foods.  Follow the diet recommended by your health care provider. General instructions  Have a responsible adult stay with you until you are awake and alert.  Return to your normal activities as told by your health care provider. Ask your health care provider what activities are safe for you.  Take over-the-counter and prescription medicines only as told by your health care provider.  If you smoke, do not smoke without supervision.  Keep all follow-up visits as told by your health care provider. This is important. Contact a health care provider if:  You continue to have nausea or vomiting at home, and medicines are not helpful.  You cannot drink fluids or start eating again.  You cannot urinate after 8-12 hours.  You develop a skin rash.  You have fever.  You have increasing redness at the site of your procedure. Get help right away if:  You have difficulty breathing.  You have chest pain.  You have unexpected bleeding.  You feel that you are having a life-threatening or urgent problem. This information is not intended to replace advice given to you by your health care provider. Make sure you discuss any questions you have with your health care provider. Document Released: 05/28/2000 Document Revised: 07/25/2015 Document  Reviewed: 02/03/2015 Elsevier Interactive Patient Education  [employer].

## 2016-08-29 NOTE — Anesthesia Procedure Notes (Signed)
Procedure Name: Intubation Date/Time: 08/29/2016 12:38 PM Performed by: Darlyne Russian Pre-anesthesia Checklist: Patient identified, Emergency Drugs available, Suction available, Patient being monitored and Timeout performed Patient Re-evaluated:Patient Re-evaluated prior to inductionOxygen Delivery Method: Circle system utilized Preoxygenation: Pre-oxygenation with 100% oxygen Intubation Type: IV induction Ventilation: Mask ventilation without difficulty Laryngoscope Size: Glidescope and 3 Grade View: Grade II Tube type: Oral Tube size: 8.0 mm Number of attempts: 1 Airway Equipment and Method: Stylet,  Video-laryngoscopy and Bite block (soft guaze bite block placed between left molars.  Not touching upper front left tooth that is loose as per preop.) Placement Confirmation: ETT inserted through vocal cords under direct vision,  positive ETCO2 and breath sounds checked- equal and bilateral Secured at: 23 cm Tube secured with: Tape Dental Injury: Teeth and Oropharynx as per pre-operative assessment  Comments: Glidescope used d/t loose upper front left tooth.

## 2016-08-29 NOTE — H&P (View-Only) (Signed)
08/02/2016  History of Present Illness: Jeremy Luna is a 61 y.o. male seen in the ED last week with a second episode of biliary colic.  He presents today for further discussion about possible surgical options. He did see his PCP today as well and was cleared from the cardiac standpoint for surgery. He reports that since his visit to the emergency department he has not had any significant pain except for a couple episodes of very minor discomfort in the right upper quadrant. Otherwise denies any nausea, vomiting, fevers, chills, chest pain, shortness of breath. He reports that he has been able to eat well and is following a low-fat diet as instructed during his visit to the emergency room. He has not required any pain medication or nausea medication.  Past Medical History: Past Medical History:  Diagnosis Date  . CAD (coronary artery disease)   . Cholelithiasis   . Diabetes mellitus without complication ([place])   . Dyslipidemia   . GERD (gastroesophageal reflux disease)   . H/O angioplasty   . Hypertension      Past Surgical History: Past Surgical History:  Procedure Laterality Date  . APPENDECTOMY    . colonscopy    . CORONARY ANGIOPLASTY WITH STENT PLACEMENT    . KNEE ARTHROSCOPY Left     Home Medications: Prior to Admission medications   Medication Sig Start Date End Date Taking? Authorizing Provider  aspirin 81 MG tablet Take 81 mg by mouth daily.   Yes [provider]  atorvastatin (LIPITOR) 40 MG tablet Take 1 tablet by mouth once. 08/02/16 08/02/17 Yes [provider]  lisinopril (PRINIVIL,ZESTRIL) 20 MG tablet Take 1 tablet by mouth once. 08/02/16  Yes [provider]  metFORMIN (GLUCOPHAGE) 500 MG tablet Take 500-1,000 mg by mouth 2 (two) times daily with a meal. Take 1000 mg by mouth in the morning and take 500 mg by mouth in the evening.   Yes [provider]  omeprazole (PRILOSEC) 20 MG capsule Take 20 mg by mouth daily.   Yes [provider]  naproxen sodium (ANAPROX) 220 MG tablet Take 220 mg by mouth 2 (two) times daily with a meal.    [provider]  nitroGLYCERIN (NITROSTAT) 0.4 MG SL tablet Place 0.4 mg under the tongue every 5 (five) minutes as needed for chest pain.    [provider]  ondansetron (ZOFRAN ODT) 4 MG disintegrating tablet Take 1 tablet (4 mg total) by mouth every 8 (eight) hours as needed for nausea or vomiting. Patient not taking: Reported on 08/02/2016 07/25/16   Jeremy Mew, MD    Allergies: No Known Allergies  Review of Systems: Review of Systems  Constitutional: Negative for chills and fever.  HENT: Negative for hearing loss.   Eyes: Negative for blurred vision.  Respiratory: Negative for cough.   Cardiovascular: Negative for chest pain.  Gastrointestinal: Negative for abdominal pain, nausea and vomiting.  Genitourinary: Negative for dysuria.  Musculoskeletal: Negative for myalgias.  Skin: Negative for rash.  Neurological: Negative for dizziness.  Psychiatric/Behavioral: Negative for depression.  All other systems reviewed and are negative.   Physical Exam BP 120/77   Pulse 67   Temp 98.1 F (36.7 C) (Oral)   Ht 5\' 11"  (1.803 m)   Wt 92.1 kg (203 lb)   BMI 28.31 kg/m  CONSTITUTIONAL: No acute distress HEENT:  Normocephalic, atraumatic, extraocular motion intact. NECK: Trachea is midline, and there is no jugular venous distension.  RESPIRATORY:  Lungs are clear, and  breath sounds are equal bilaterally. Normal respiratory effort without pathologic use of accessory muscles. CARDIOVASCULAR: Heart is regular without murmurs, gallops, or rubs. GI: The abdomen is soft, nondistended, nontender to palpation. Negative Murphy's sign. There were no palpable masses.  MUSCULOSKELETAL:  Normal muscle strength and tone in all four extremities.  No peripheral edema or cyanosis. SKIN: Skin turgor is normal. There are no pathologic skin lesions.  NEUROLOGIC:  Motor  and sensation is grossly normal.  Cranial nerves are grossly intact. PSYCH:  Alert and oriented to person, place and time. Affect is normal.  Labs/Imaging: Ultrasound from 5/23 revealed small gallstones with no evidence of acute cholecystitis including no gallbladder wall thickening or pericholecystic fluid and a negative Murphy's sign. His labs were also normal with a white blood cell count of 5.5 and normal LFTs.  Assessment and Plan: This is a 61 y.o. male who presents with history of biliary colic now having had 2 episodes this month.  Discussed with the patient regarding surgical management for biliary colic. Plan would be for a laparoscopic cholecystectomy with the possibility of converting to open. Discussed with the patient the risks and benefits of the procedure including risk of bleeding, infection, and injury to surrounding structures. Discussed with the patient the postoperative outcomes and expectations. He does understand that he will be limited to no heavy lifting of more than 10-15 pounds for a period of 4 weeks. He also understands that there is an outpatient procedure although is converted to open may have to stay in the hospital for a couple of days for pain control. Currently the patient will be scheduled for June 27. He will stop his aspirin 3 days before that. Patient understands this plan and all of his questions have been answered.   Jeremy Luna, [name]

## 2016-08-29 NOTE — Anesthesia Postprocedure Evaluation (Signed)
Anesthesia Post Note  Patient: Jeremy Luna  Procedure(s) Performed: Procedure(s) (LRB): LAPAROSCOPIC CHOLECYSTECTOMY (N/A)  Patient location during evaluation: PACU Anesthesia Type: General Level of consciousness: awake and alert and oriented Pain management: pain level controlled Vital Signs Assessment: post-procedure vital signs reviewed and stable Respiratory status: spontaneous breathing, nonlabored ventilation and respiratory function stable Cardiovascular status: blood pressure returned to baseline and stable Postop Assessment: no signs of nausea or vomiting Anesthetic complications: no     Last Vitals:  Vitals:   08/29/16 1405 08/29/16 1415  BP: (!) 179/90 (!) 148/84  Pulse: 87 73  Resp: 14 (!) 9  Temp: 36.3 C     Last Pain:  Vitals:   08/29/16 1405  TempSrc: Temporal                 Gillie Crisci

## 2016-08-29 NOTE — Transfer of Care (Signed)
Immediate Anesthesia Transfer of Care Note  Patient: Jeremy Luna  Procedure(s) Performed: Procedure(s): LAPAROSCOPIC CHOLECYSTECTOMY (N/A)  Patient Location: PACU  Anesthesia Type:General  Level of Consciousness: drowsy  Airway & Oxygen Therapy: Patient Spontanous Breathing and Patient connected to face mask oxygen  Post-op Assessment: Report given to RN and Post -op Vital signs reviewed and stable  Post vital signs: Reviewed and stable  Last Vitals:  Vitals:   08/29/16 1025 08/29/16 1405  BP: 130/87 (!) 179/90  Pulse: 64 87  Resp: 16 14  Temp: 36.7 C 36.3 C    Last Pain:  Vitals:   08/29/16 1405  TempSrc: Temporal         Complications: No apparent anesthesia complications

## 2016-08-29 NOTE — Anesthesia Preprocedure Evaluation (Signed)
Anesthesia Evaluation  Patient identified by MRN, date of birth, ID band Patient awake    Reviewed: Allergy & Precautions, NPO status , Patient's Chart, lab work & pertinent test results  History of Anesthesia Complications (+) PONV and history of anesthetic complications  Airway Mallampati: II  TM Distance: >3 FB Neck ROM: Full    Dental  (+)    Pulmonary neg pulmonary ROS, neg sleep apnea, neg COPD,    breath sounds clear to auscultation- rhonchi (-) wheezing      Cardiovascular hypertension, (-) angina+ CAD and + Cardiac Stents (last stent 2013)  (-) Past MI  Rhythm:Regular Rate:Normal - Systolic murmurs and - Diastolic murmurs    Neuro/Psych negative neurological ROS  negative psych ROS   GI/Hepatic Neg liver ROS, GERD  ,  Endo/Other  diabetes, Oral Hypoglycemic Agents  Renal/GU negative Renal ROS     Musculoskeletal negative musculoskeletal ROS (+)   Abdominal (+) - obese,   Peds  Hematology negative hematology ROS (+)   Anesthesia Other Findings Past Medical History: No date: CAD (coronary artery disease) No date: Cholelithiasis No date: Diabetes mellitus without complication ([facility]) No date: Dyslipidemia No date: GERD (gastroesophageal reflux disease) No date: H/O angioplasty No date: Hypertension No date: PONV (postoperative nausea and vomiting)   Reproductive/Obstetrics                             Anesthesia Physical Anesthesia Plan  ASA: III  Anesthesia Plan: General   Post-op Pain Management:    Induction: Intravenous  PONV Risk Score and Plan: 2 and Ondansetron, Dexamethasone and Midazolam  Airway Management Planned: Oral ETT  Additional Equipment:   Intra-op Plan:   Post-operative Plan: Extubation in OR  Informed Consent: I have reviewed the patients History and Physical, chart, labs and discussed the procedure including the risks, benefits and alternatives  for the proposed anesthesia with the patient or authorized representative who has indicated his/her understanding and acceptance.   Dental advisory given  Plan Discussed with: CRNA and Anesthesiologist  Anesthesia Plan Comments:         Anesthesia Quick Evaluation

## 2016-08-29 NOTE — Anesthesia Post-op Follow-up Note (Cosign Needed)
Anesthesia QCDR form completed.        

## 2016-08-30 ENCOUNTER — Encounter: Payer: Self-pay | Admitting: Surgery

## 2016-08-30 LAB — SURGICAL PATHOLOGY

## 2016-09-17 ENCOUNTER — Encounter: Payer: Self-pay | Admitting: Surgery

## 2016-09-17 ENCOUNTER — Ambulatory Visit (INDEPENDENT_AMBULATORY_CARE_PROVIDER_SITE_OTHER): Payer: BLUE CROSS/BLUE SHIELD | Admitting: Surgery

## 2016-09-17 VITALS — BP 158/82 | HR 73 | Temp 98.3°F | Ht 71.0 in | Wt 203.0 lb

## 2016-09-17 DIAGNOSIS — Z09 Encounter for follow-up examination after completed treatment for conditions other than malignant neoplasm: Secondary | ICD-10-CM

## 2016-09-17 NOTE — Progress Notes (Signed)
09/17/2016  HPI: Patient is s/p laparoscopic cholecystectomy on 1/46 for biliary colic.  He presents today for post-op follow up.  He denies having any issues at the time.  He does report that initially he had loose stools as we had discussed prior to surgery, and that this returned to normal a while back.  Denies any nausea, vomiting, fevers, chills, chest pain, shortness of breath, worsening abdominal pain, or issues with the incisions.  Vital signs: BP (!) 158/82   Pulse 73   Temp 98.3 F (36.8 C) (Oral)   Ht 5\' 11"  (1.803 m)   Wt 92.1 kg (203 lb)   BMI 28.31 kg/m    Physical Exam: Constitutional: No acute distress Abdomen:  Soft, nondistended, nontender to palpation.  All incisions are clean, dry, intact and healing well without any evidence of infection.  Assessment/Plan: 61 yo male s/p laparoscopic cholecystectomy.  --Reviewed pathology with patient -- chronic cholecystitis and cholesterolosis --Discussed that he still has until 7/25 the no heavy lifting or pushing restriction.  He can resume regular activities aftewards. --Patient may follow up on an as needed basis.   [name], [place]

## 2016-09-17 NOTE — Patient Instructions (Signed)
Please call our office with any questions or concerns.  Please do not submerge in a tub, hot tub, or pool until incisions are completely sealed.  Use sun block to incision area over the next year if this area will be exposed to sun. This helps decrease scarring.  You may resume your normal activities on 09/26/16. At that time- Listen to your body when lifting, if you have pain when lifting, stop and then try again in a few days. Pain after doing exercises or activities of daily living is normal as you get back in to your normal routine.  If you develop redness, drainage, or pain at incision sites- call our office immediately and speak with a nurse.

## 2016-09-21 ENCOUNTER — Ambulatory Visit: Admit: 2016-09-21 | Payer: BLUE CROSS/BLUE SHIELD | Admitting: Unknown Physician Specialty

## 2016-09-21 SURGERY — ESOPHAGOGASTRODUODENOSCOPY (EGD) WITH PROPOFOL
Anesthesia: General

## 2016-12-10 DIAGNOSIS — K222 Esophageal obstruction: Secondary | ICD-10-CM | POA: Insufficient documentation

## 2017-03-21 ENCOUNTER — Encounter: Payer: Self-pay | Admitting: *Deleted

## 2017-03-22 ENCOUNTER — Ambulatory Visit: Payer: BLUE CROSS/BLUE SHIELD | Admitting: Anesthesiology

## 2017-03-22 ENCOUNTER — Encounter: Payer: Self-pay | Admitting: *Deleted

## 2017-03-22 ENCOUNTER — Ambulatory Visit
Admission: RE | Admit: 2017-03-22 | Discharge: 2017-03-22 | Disposition: A | Discharge status: Home / Self Care | Payer: BLUE CROSS/BLUE SHIELD | Source: Ambulatory Visit | Attending: Unknown Physician Specialty | Admitting: Unknown Physician Specialty

## 2017-03-22 ENCOUNTER — Encounter
Admission: RE | Disposition: A | Discharge status: Home / Self Care | Payer: Self-pay | Source: Ambulatory Visit | Attending: Unknown Physician Specialty

## 2017-03-22 DIAGNOSIS — Z1211 Encounter for screening for malignant neoplasm of colon: Secondary | ICD-10-CM | POA: Insufficient documentation

## 2017-03-22 DIAGNOSIS — K219 Gastro-esophageal reflux disease without esophagitis: Secondary | ICD-10-CM | POA: Insufficient documentation

## 2017-03-22 DIAGNOSIS — I2510 Atherosclerotic heart disease of native coronary artery without angina pectoris: Secondary | ICD-10-CM | POA: Insufficient documentation

## 2017-03-22 DIAGNOSIS — Z86010 Personal history of colonic polyps: Secondary | ICD-10-CM | POA: Diagnosis not present

## 2017-03-22 DIAGNOSIS — K5730 Diverticulosis of large intestine without perforation or abscess without bleeding: Secondary | ICD-10-CM | POA: Diagnosis not present

## 2017-03-22 DIAGNOSIS — E119 Type 2 diabetes mellitus without complications: Secondary | ICD-10-CM | POA: Diagnosis not present

## 2017-03-22 DIAGNOSIS — E785 Hyperlipidemia, unspecified: Secondary | ICD-10-CM | POA: Diagnosis not present

## 2017-03-22 DIAGNOSIS — Z79899 Other long term (current) drug therapy: Secondary | ICD-10-CM | POA: Diagnosis not present

## 2017-03-22 DIAGNOSIS — Z955 Presence of coronary angioplasty implant and graft: Secondary | ICD-10-CM | POA: Insufficient documentation

## 2017-03-22 DIAGNOSIS — Z8371 Family history of colonic polyps: Secondary | ICD-10-CM | POA: Diagnosis not present

## 2017-03-22 DIAGNOSIS — Z7982 Long term (current) use of aspirin: Secondary | ICD-10-CM | POA: Insufficient documentation

## 2017-03-22 DIAGNOSIS — Z7984 Long term (current) use of oral hypoglycemic drugs: Secondary | ICD-10-CM | POA: Insufficient documentation

## 2017-03-22 DIAGNOSIS — K319 Disease of stomach and duodenum, unspecified: Secondary | ICD-10-CM | POA: Insufficient documentation

## 2017-03-22 DIAGNOSIS — K640 First degree hemorrhoids: Secondary | ICD-10-CM | POA: Insufficient documentation

## 2017-03-22 DIAGNOSIS — K2960 Other gastritis without bleeding: Secondary | ICD-10-CM | POA: Insufficient documentation

## 2017-03-22 DIAGNOSIS — I10 Essential (primary) hypertension: Secondary | ICD-10-CM | POA: Diagnosis not present

## 2017-03-22 HISTORY — PX: ESOPHAGOGASTRODUODENOSCOPY (EGD) WITH PROPOFOL: SHX5813

## 2017-03-22 HISTORY — PX: COLONOSCOPY WITH PROPOFOL: SHX5780

## 2017-03-22 HISTORY — DX: Disorder of arteries and arterioles, unspecified: I77.9

## 2017-03-22 HISTORY — DX: Peripheral vascular disease, unspecified: I73.9

## 2017-03-22 HISTORY — DX: Esophageal obstruction: K22.2

## 2017-03-22 SURGERY — ESOPHAGOGASTRODUODENOSCOPY (EGD) WITH PROPOFOL
Anesthesia: General

## 2017-03-22 MED ORDER — ONDANSETRON HCL 4 MG/2ML IJ SOLN
INTRAMUSCULAR | Status: DC | PRN
  Administered 2017-03-22: 4 mg via INTRAVENOUS

## 2017-03-22 MED ORDER — PROPOFOL 10 MG/ML IV BOLUS
INTRAVENOUS | Status: DC | PRN
  Administered 2017-03-22: 30 mg via INTRAVENOUS
  Administered 2017-03-22: 20 mg via INTRAVENOUS

## 2017-03-22 MED ORDER — SODIUM CHLORIDE 0.9 % IV SOLN
INTRAVENOUS | Status: DC
  Administered 2017-03-22: 1000 mL via INTRAVENOUS

## 2017-03-22 MED ORDER — FENTANYL CITRATE (PF) 100 MCG/2ML IJ SOLN
INTRAMUSCULAR | Status: DC | PRN
  Administered 2017-03-22: 50 ug via INTRAVENOUS

## 2017-03-22 MED ORDER — GLYCOPYRROLATE 0.2 MG/ML IJ SOLN
INTRAMUSCULAR | Status: AC
  Filled 2017-03-22: qty 1

## 2017-03-22 MED ORDER — GLYCOPYRROLATE 0.2 MG/ML IJ SOLN
INTRAMUSCULAR | Status: DC | PRN
  Administered 2017-03-22: 0.2 mg via INTRAVENOUS

## 2017-03-22 MED ORDER — LIDOCAINE HCL (PF) 2 % IJ SOLN
INTRAMUSCULAR | Status: DC | PRN
  Administered 2017-03-22: 100 mg

## 2017-03-22 MED ORDER — MIDAZOLAM HCL 2 MG/2ML IJ SOLN
INTRAMUSCULAR | Status: AC
Start: 2017-03-22 — End: 2017-03-22
  Filled 2017-03-22: qty 2

## 2017-03-22 MED ORDER — PROPOFOL 500 MG/50ML IV EMUL
INTRAVENOUS | Status: AC
  Filled 2017-03-22: qty 50

## 2017-03-22 MED ORDER — PROPOFOL 500 MG/50ML IV EMUL
INTRAVENOUS | Status: DC | PRN
  Administered 2017-03-22: 75 ug/kg/min via INTRAVENOUS

## 2017-03-22 MED ORDER — MIDAZOLAM HCL 5 MG/5ML IJ SOLN
INTRAMUSCULAR | Status: DC | PRN
  Administered 2017-03-22: 2 mg via INTRAVENOUS

## 2017-03-22 MED ORDER — LIDOCAINE HCL (PF) 2 % IJ SOLN
INTRAMUSCULAR | Status: AC
Start: 2017-03-22 — End: 2017-03-22
  Filled 2017-03-22: qty 10

## 2017-03-22 MED ORDER — EPHEDRINE SULFATE 50 MG/ML IJ SOLN
INTRAMUSCULAR | Status: AC
  Filled 2017-03-22: qty 1

## 2017-03-22 MED ORDER — SODIUM CHLORIDE 0.9 % IV SOLN: INTRAVENOUS | Status: DC

## 2017-03-22 MED ORDER — FENTANYL CITRATE (PF) 100 MCG/2ML IJ SOLN
INTRAMUSCULAR | Status: AC
  Filled 2017-03-22: qty 2

## 2017-03-22 NOTE — Anesthesia Post-op Follow-up Note (Signed)
Anesthesia QCDR form completed.        

## 2017-03-22 NOTE — Transfer of Care (Signed)
Immediate Anesthesia Transfer of Care Note  Patient: Jeremy Luna  Procedure(s) Performed: ESOPHAGOGASTRODUODENOSCOPY (EGD) WITH PROPOFOL (N/A ) COLONOSCOPY WITH PROPOFOL (N/A )  Patient Location: PACU  Anesthesia Type:General  Level of Consciousness: sedated  Airway & Oxygen Therapy: Patient Spontanous Breathing and Patient connected to nasal cannula oxygen  Post-op Assessment: Report given to RN and Post -op Vital signs reviewed and stable  Post vital signs: Reviewed and stable  Last Vitals:  Vitals:   03/22/17 0732  BP: 131/79  Pulse: 75  Resp: 16  Temp: (!) 36 C  SpO2: 99%    Last Pain:  Vitals:   03/22/17 0732  TempSrc: Tympanic         Complications: No apparent anesthesia complications

## 2017-03-22 NOTE — Anesthesia Preprocedure Evaluation (Signed)
Anesthesia Evaluation  Patient identified by MRN, date of birth, ID band Patient awake    Reviewed: Allergy & Precautions, H&P , NPO status , Patient's Chart, lab work & pertinent test results  History of Anesthesia Complications (+) PONV and history of anesthetic complications  Airway Mallampati: III  TM Distance: >3 FB Neck ROM: full    Dental  (+) Chipped, Poor Dentition, Loose   Pulmonary neg pulmonary ROS,           Cardiovascular hypertension, + CAD and + Peripheral Vascular Disease  negative cardio ROS       Neuro/Psych negative neurological ROS  negative psych ROS   GI/Hepatic negative GI ROS, Neg liver ROS, GERD  ,  Endo/Other  negative endocrine ROSdiabetes  Renal/GU negative Renal ROS  negative genitourinary   Musculoskeletal   Abdominal   Peds  Hematology negative hematology ROS (+)   Anesthesia Other Findings Past Medical History: 04/02/2016: Benign essential hypertension No date: CAD (coronary artery disease) No date: Carotid artery disease (HCC)     Comment:  stent PCI 11/1999, antioplasty 2/02, persistent chest               pain No date: Cholelithiasis 12/15/2010: Coronary artery disease     Comment:  Overview:   Progressive angina - onset early 2001.                 08/1999 Worsening angina, stent PCI of the RCA ([place]).               11/1999 Recurrent angina, stent PCI of the LAD               ([place]).   03/2000 Persistent intermittent chest               discomfort Relook catheterization revealing in-stent               restenosis of the LAD.   04/2000 Redo angioplasty of the               LAD.   Persistent intermittent chest pain.   09/11/2000               Relook catheterizat No date: Diabetes mellitus without complication (HCC) No date: Dyslipidemia No date: GERD (gastroesophageal reflux disease) No date: H/O angioplasty No date: Hypertension 07/27/2013: Leg pain  Comment:  Overview:   12/2012 ABIs: R> 1.39, L. 1.40 06/22/2016: Other dysphagia No date: PONV (postoperative nausea and vomiting) No date: Schatzki's ring No date: Symptomatic cholelithiasis 04/02/2016: Tubular adenoma     Comment:  Overview:  6/132  Past Surgical History: No date: APPENDECTOMY 08/29/2016: CHOLECYSTECTOMY; N/A     Comment:  Procedure: LAPAROSCOPIC CHOLECYSTECTOMY;  Surgeon:               Olean Ree, MD;  Location: [facility] ORS;  Service:               General;  Laterality: N/A; No date: colonscopy     Comment:  polypectomy 2001 + 2013: CORONARY ANGIOPLASTY WITH STENT PLACEMENT     Comment:  x 2, total of 10 No date: KNEE ARTHROSCOPY; Left  BMI    Body Mass Index:  27.20 kg/m      Reproductive/Obstetrics negative OB ROS                             Anesthesia Physical Anesthesia Plan  ASA: III  Anesthesia Plan:  General   Post-op Pain Management:    Induction: Intravenous  PONV Risk Score and Plan:   Airway Management Planned: Natural Airway and Nasal Cannula  Additional Equipment:   Intra-op Plan:   Post-operative Plan:   Informed Consent: I have reviewed the patients History and Physical, chart, labs and discussed the procedure including the risks, benefits and alternatives for the proposed anesthesia with the patient or authorized representative who has indicated his/her understanding and acceptance.   Dental Advisory Given  Plan Discussed with: Anesthesiologist, CRNA and Surgeon  Anesthesia Plan Comments: (Patient reports that his left upper medial incisor is somewhat loose but not very loose.  Patient consented that the bite block used to protect his teeth for the procedure may make his tooth even more loose.  Patient voiced understanding.  Patient consented for risks of anesthesia including but not limited to:  - adverse reactions to medications - risk of intubation if required - damage to teeth, lips or other oral  mucosa - sore throat or hoarseness - Damage to heart, brain, lungs or loss of life  Patient voiced understanding.)        Anesthesia Quick Evaluation

## 2017-03-22 NOTE — Op Note (Signed)
[facility] Gastroenterology Patient Name: Jeremy Luna Procedure Date: 03/22/2017 8:30 AM MRN: [id number] Account #: [id number] Date of Birth: 12/14/1955 Admit Type: Outpatient Age: 62 Room: [facility] ENDO ROOM 3 Gender: Male Note Status: Finalized Procedure:            Colonoscopy Indications:          High risk colon cancer surveillance: Personal history                        of colonic polyps Providers:            Manya Silvas, MD Referring MD:         Rusty Aus, MD (Referring MD) Medicines:            Propofol per Anesthesia Complications:        No immediate complications. Procedure:            Pre-Anesthesia Assessment:                       - After reviewing the risks and benefits, the patient                        was deemed in satisfactory condition to undergo the                        procedure.                       After obtaining informed consent, the colonoscope was                        passed under direct vision. Throughout the procedure,                        the patient's blood pressure, pulse, and oxygen                        saturations were monitored continuously. The                        Colonoscope was introduced through the anus and                        advanced to the the cecum, identified by appendiceal                        orifice and ileocecal valve. The colonoscopy was                        performed without difficulty. The patient tolerated the                        procedure well. The quality of the bowel preparation                        was excellent. Findings:      A few small-mouthed diverticula were found in the sigmoid colon.      Internal hemorrhoids were found during endoscopy. The hemorrhoids were       small and Grade I (internal hemorrhoids that do not prolapse).      The exam was  otherwise without abnormality. Impression:           - Diverticulosis in the sigmoid colon.                       -  Internal hemorrhoids.                       - The examination was otherwise normal.                       - No specimens collected. Recommendation:       - Repeat colonoscopy in 5 years for surveillance. Manya Silvas, MD 03/22/2017 9:04:18 AM This report has been signed electronically. Number of Addenda: 0 Note Initiated On: 03/22/2017 8:30 AM Scope Withdrawal Time: 0 hours 10 minutes 50 seconds  Total Procedure Duration: 0 hours 16 minutes 34 seconds       [facility]

## 2017-03-22 NOTE — Op Note (Signed)
[facility] Gastroenterology Patient Name: Jeremy Luna Procedure Date: 03/22/2017 8:31 AM MRN: [id number] Account #: [id number] Date of Birth: [birth year] Admit Type: Outpatient Age: 62 Room: [facility] ENDO ROOM 3 Gender: Male Note Status: Finalized Procedure:            Upper GI endoscopy Indications:          Dysphagia Providers:            Manya Silvas, MD Referring MD:         Rusty Aus, MD (Referring MD) Medicines:            Propofol per Anesthesia Complications:        No immediate complications. Procedure:            Pre-Anesthesia Assessment:                       - After reviewing the risks and benefits, the patient                        was deemed in satisfactory condition to undergo the                        procedure.                       - After reviewing the risks and benefits, the patient                        was deemed in satisfactory condition to undergo the                        procedure.                       After obtaining informed consent, the endoscope was                        passed under direct vision. Throughout the procedure,                        the patient's blood pressure, pulse, and oxygen                        saturations were monitored continuously. The                        Colonoscope was introduced through the mouth, and                        advanced to the second part of duodenum. The upper GI                        endoscopy was accomplished without difficulty. The                        patient tolerated the procedure well. Findings:      The examined esophagus was normal. GEJ shows an oval ring without       obstruction. No restriction on scope from the gastroesophageal ring.      Patchy mild inflammation characterized by erythema and granularity was       found in the  gastric antrum. Biopsies were taken with a cold forceps for       histology.      The examined duodenum was normal. Impression:            - Normal esophagus.                       - Gastritis. Biopsied.                       - Normal examined duodenum. Recommendation:       - Await pathology results. Manya Silvas, MD 03/22/2017 8:43:13 AM This report has been signed electronically. Number of Addenda: 0 Note Initiated On: 03/22/2017 8:31 AM      [facility]

## 2017-03-22 NOTE — Anesthesia Postprocedure Evaluation (Signed)
Anesthesia Post Note  Patient: Jeremy Luna  Procedure(s) Performed: ESOPHAGOGASTRODUODENOSCOPY (EGD) WITH PROPOFOL (N/A ) COLONOSCOPY WITH PROPOFOL (N/A )  Patient location during evaluation: Endoscopy Anesthesia Type: General Level of consciousness: awake and alert Pain management: pain level controlled Vital Signs Assessment: post-procedure vital signs reviewed and stable Respiratory status: spontaneous breathing, nonlabored ventilation, respiratory function stable and patient connected to nasal cannula oxygen Cardiovascular status: blood pressure returned to baseline and stable Postop Assessment: no apparent nausea or vomiting Anesthetic complications: no     Last Vitals:  Vitals:   03/22/17 0920 03/22/17 0930  BP: 126/85 105/80  Pulse: 90 65  Resp: (!) 21 10  Temp:    SpO2: 100% 100%    Last Pain:  Vitals:   03/22/17 0900  TempSrc: Tympanic                 Precious Haws Emmy Keng

## 2017-03-22 NOTE — H&P (Signed)
Primary Care Physician:  Rusty Aus, MD Primary Gastroenterologist:  Dr. Vira Agar  Pre-Procedure History & Physical: HPI:  Jeremy Luna is a 62 y.o. male is here for an endoscopy and colonoscopy.   Past Medical History:  Diagnosis Date  . Benign essential hypertension 04/02/2016  . CAD (coronary artery disease)   . Carotid artery disease ([place])    stent PCI 11/1999, antioplasty 2/02, persistent chest pain  . Cholelithiasis   . Coronary artery disease 12/15/2010   Overview:   Progressive angina - onset early 2001.   08/1999 Worsening angina, stent PCI of the RCA ([place]).   11/1999 Recurrent angina, stent PCI of the LAD ([place]).   03/2000 Persistent intermittent chest discomfort Relook catheterization revealing in-stent restenosis of the LAD.   04/2000 Redo angioplasty of the LAD.   Persistent intermittent chest pain.   09/11/2000 Relook catheterizat  . Diabetes mellitus without complication ([place])   . Dyslipidemia   . GERD (gastroesophageal reflux disease)   . H/O angioplasty   . Hypertension   . Leg pain 07/27/2013   Overview:   12/2012 ABIs: R> 1.39, L. 1.40  . Other dysphagia 06/22/2016  . PONV (postoperative nausea and vomiting)   . Schatzki's ring   . Symptomatic cholelithiasis   . Tubular adenoma 04/02/2016   Overview:  6/132    Past Surgical History:  Procedure Laterality Date  . APPENDECTOMY    . CHOLECYSTECTOMY N/A 08/29/2016   Procedure: LAPAROSCOPIC CHOLECYSTECTOMY;  Surgeon: Olean Ree, MD;  Location: [facility] ORS;  Service: General;  Laterality: N/A;  . colonscopy     polypectomy  . CORONARY ANGIOPLASTY WITH STENT PLACEMENT  2001 + 2013   x 2, total of 10  . KNEE ARTHROSCOPY Left     Prior to Admission medications   Medication Sig Start Date End Date Taking? Authorizing Provider  aspirin 81 MG tablet Take 81 mg by mouth daily.   Yes [provider]  atorvastatin (LIPITOR) 40 MG tablet Take 40 mg by mouth daily.  08/02/16 08/02/17 Yes [provider]  ibuprofen (ADVIL,MOTRIN) 200 MG tablet Take 400-800 mg by mouth every 6 (six) hours as needed for mild pain.   Yes [provider]  lisinopril (PRINIVIL,ZESTRIL) 20 MG tablet Take 20 mg by mouth daily.  08/02/16  Yes [provider]  metFORMIN (GLUCOPHAGE) 500 MG tablet Take 500-1,000 mg by mouth 2 (two) times daily with a meal. Take 1000 mg by mouth in the morning and take 500 mg by mouth in the evening.   Yes [provider]  omeprazole (PRILOSEC) 20 MG capsule Take 20 mg by mouth 3 (three) times a week.    Yes [provider]  nitroGLYCERIN (NITROSTAT) 0.4 MG SL tablet Place 0.4 mg under the tongue every 5 (five) minutes as needed for chest pain.    [provider]    Allergies as of 01/21/2017  . (No Known Allergies)    Family History  Problem Relation Age of Onset  . Colon polyps Mother   . CAD Father   . Heart attack Father     Social History   Socioeconomic History  . Marital status: Married    Spouse name: Not on file  . Number of children: Not on file  . Years of education: Not on file  . Highest education level: Not on file  Social Needs  . Financial resource strain: Not on file  . Food insecurity - worry: Not on file  .  Food insecurity - inability: Not on file  . Transportation needs - medical: Not on file  . Transportation needs - non-medical: Not on file  Occupational History  . Not on file  Tobacco Use  . Smoking status: Never Smoker  . Smokeless tobacco: Never Used  Substance and Sexual Activity  . Alcohol use: Yes    Alcohol/week: 0.0 - 1.8 oz  . Drug use: No  . Sexual activity: Not on file  Other Topics Concern  . Not on file  Social History Narrative  . Not on file    Review of Systems: See HPI, otherwise negative ROS  Physical Exam: BP 131/79   Pulse 75   Temp (!) 96.8 F (36 C) (Tympanic)   Resp 16   Ht 5\' 11"  (1.803 m)   Wt 88.5 kg (195 lb)   SpO2 99%   BMI 27.20 kg/m   General:   Alert,  pleasant and cooperative in NAD Head:  Normocephalic and atraumatic. Neck:  Supple; no masses or thyromegaly. Lungs:  Clear throughout to auscultation.    Heart:  Regular rate and rhythm. Abdomen:  Soft, nontender and nondistended. Normal bowel sounds, without guarding, and without rebound.   Neurologic:  Alert and  oriented x4;  grossly normal neurologically.  Impression/Plan: Jeremy Luna is here for an endoscopy and colonoscopy to be performed for dysphagia and esophageal  ring and PH colon polyps.  Risks, benefits, limitations, and alternatives regarding  endoscopy and colonoscopy have been reviewed with the patient.  Questions have been answered.  All parties agreeable.   Gaylyn Cheers, MD  03/22/2017, 8:26 AM

## 2017-03-23 ENCOUNTER — Encounter: Payer: Self-pay | Admitting: Unknown Physician Specialty

## 2017-03-25 LAB — SURGICAL PATHOLOGY

## 2017-04-08 DIAGNOSIS — E782 Mixed hyperlipidemia: Secondary | ICD-10-CM | POA: Insufficient documentation

## 2017-04-30 DIAGNOSIS — M1712 Unilateral primary osteoarthritis, left knee: Secondary | ICD-10-CM | POA: Insufficient documentation

## 2017-10-28 DIAGNOSIS — E782 Mixed hyperlipidemia: Secondary | ICD-10-CM

## 2017-10-28 DIAGNOSIS — E1169 Type 2 diabetes mellitus with other specified complication: Secondary | ICD-10-CM | POA: Insufficient documentation

## 2018-05-04 NOTE — Discharge Instructions (Signed)
°  Instructions after Total Knee Replacement ° ° Emberleigh Reily P. Ramil Edgington, Jr., M.D.    ° Dept. of Orthopaedics & Sports Medicine ° [facility] ° [address] ° [place], [place]  [address] ° Phone: [phone]   Fax: [phone] ° °  °DIET: °• Drink plenty of non-alcoholic fluids. °• Resume your normal diet. Include foods high in fiber. ° °ACTIVITY:  °• You may use crutches or a walker with weight-bearing as tolerated, unless instructed otherwise. °• You may be weaned off of the walker or crutches by your Physical Therapist.  °• Do NOT place pillows under the knee. Anything placed under the knee could limit your ability to straighten the knee.   °• Continue doing gentle exercises. Exercising will reduce the pain and swelling, increase motion, and prevent muscle weakness.   °• Please continue to use the TED compression stockings for 6 weeks. You may remove the stockings at night, but should reapply them in the morning. °• Do not drive or operate any equipment until instructed. ° °WOUND CARE:  °• Continue to use the PolarCare or ice packs periodically to reduce pain and swelling. °• You may bathe or shower after the staples are removed at the first office visit following surgery. ° °MEDICATIONS: °• You may resume your regular medications. °• Please take the pain medication as prescribed on the medication. °• Do not take pain medication on an empty stomach. °• You have been given a prescription for a blood thinner (Lovenox or Coumadin). Please take the medication as instructed. (NOTE: After completing a 2 week course of Lovenox, take one Enteric-coated aspirin once a day. This along with elevation will help reduce the possibility of phlebitis in your operated leg.) °• Do not drive or drink alcoholic beverages when taking pain medications. ° °CALL THE OFFICE FOR: °• Temperature above 101 degrees °• Excessive bleeding or drainage on the dressing. °• Excessive swelling, coldness, or paleness of the toes. °• Persistent  nausea and vomiting. ° °FOLLOW-UP:  °• You should have an appointment to return to the office in 10-14 days after surgery. °• Arrangements have been made for continuation of Physical Therapy (either home therapy or outpatient therapy). °  °

## 2018-05-07 ENCOUNTER — Other Ambulatory Visit: Payer: Self-pay

## 2018-05-07 ENCOUNTER — Encounter
Admission: RE | Admit: 2018-05-07 | Discharge: 2018-05-07 | Disposition: A | Discharge status: Home / Self Care | Payer: BLUE CROSS/BLUE SHIELD | Source: Ambulatory Visit | Attending: Orthopedic Surgery | Admitting: Orthopedic Surgery

## 2018-05-07 DIAGNOSIS — Z01812 Encounter for preprocedural laboratory examination: Secondary | ICD-10-CM | POA: Diagnosis not present

## 2018-05-07 LAB — URINALYSIS, ROUTINE W REFLEX MICROSCOPIC
Bilirubin Urine: NEGATIVE
Glucose, UA: NEGATIVE mg/dL
Hgb urine dipstick: NEGATIVE
Ketones, ur: NEGATIVE mg/dL
Leukocytes,Ua: NEGATIVE
Nitrite: NEGATIVE
PROTEIN: NEGATIVE mg/dL
Specific Gravity, Urine: 1.02 (ref 1.005–1.030)
pH: 5 (ref 5.0–8.0)

## 2018-05-07 LAB — COMPREHENSIVE METABOLIC PANEL
ALBUMIN: 4.3 g/dL (ref 3.5–5.0)
ALT: 28 U/L (ref 0–44)
AST: 25 U/L (ref 15–41)
Alkaline Phosphatase: 60 U/L (ref 38–126)
Anion gap: 9 (ref 5–15)
BUN: 13 mg/dL (ref 8–23)
CO2: 24 mmol/L (ref 22–32)
Calcium: 9 mg/dL (ref 8.9–10.3)
Chloride: 109 mmol/L (ref 98–111)
Creatinine, Ser: 0.65 mg/dL (ref 0.61–1.24)
GFR calc Af Amer: >60 mL/min (ref >60)
GFR calc non Af Amer: >60 mL/min (ref >60)
GLUCOSE: 135 mg/dL — AB (ref 70–99)
Potassium: 4 mmol/L (ref 3.5–5.1)
Sodium: 142 mmol/L (ref 135–145)
Total Bilirubin: 1.1 mg/dL (ref 0.3–1.2)
Total Protein: 6.9 g/dL (ref 6.5–8.1)

## 2018-05-07 LAB — CBC
HCT: 41.2 % (ref 39.0–52.0)
Hemoglobin: 13.9 g/dL (ref 13.0–17.0)
MCH: 28.1 pg (ref 26.0–34.0)
MCHC: 33.7 g/dL (ref 30.0–36.0)
MCV: 83.2 fL (ref 80.0–100.0)
Platelets: 191 10*3/uL (ref 150–400)
RBC: 4.95 MIL/uL (ref 4.22–5.81)
RDW: 13.2 % (ref 11.5–15.5)
WBC: 4.2 10*3/uL (ref 4.0–10.5)
nRBC: 0 % (ref 0.0–0.2)

## 2018-05-07 LAB — PROTIME-INR
INR: 1 (ref 0.8–1.2)
Prothrombin Time: 13.2 seconds (ref 11.4–15.2)

## 2018-05-07 LAB — TYPE AND SCREEN
ABO/RH(D): A POS
Antibody Screen: NEGATIVE

## 2018-05-07 LAB — HEMOGLOBIN A1C
Hgb A1c MFr Bld: 6.5 % — ABNORMAL HIGH (ref 4.8–5.6)
MEAN PLASMA GLUCOSE: 139.85 mg/dL

## 2018-05-07 LAB — C-REACTIVE PROTEIN

## 2018-05-07 LAB — SURGICAL PCR SCREEN
MRSA, PCR: NEGATIVE
Staphylococcus aureus: NEGATIVE

## 2018-05-07 LAB — APTT: APTT: 28 s (ref 24–36)

## 2018-05-07 LAB — SEDIMENTATION RATE: SED RATE: 5 mm/h (ref 0–20)

## 2018-05-07 NOTE — Patient Instructions (Signed)
Your procedure is scheduled on: Monday May 19, 2018 Report to Day Surgery on the 2nd floor of the [facility]. To find out your arrival time, please call [phone] between 1PM - 3PM on: Friday May 16, 2018  REMEMBER: Instructions that are not followed completely may result in serious medical risk, up to and including death; or upon the discretion of your surgeon and anesthesiologist your surgery may need to be rescheduled.  Do not eat food after midnight the night before surgery.  No gum chewing, lozengers or hard candies.  You may however, drink CLEAR liquids up to 2 hours before you are scheduled to arrive for your surgery. Do not drink anything within 2 hours of the start of your surgery.  Clear liquids include: - water     Do NOT drink anything that is not on this list.  Type 1 and Type 2 diabetics should only drink water.  No Alcohol for 24 hours before or after surgery.  No Smoking including e-cigarettes for 24 hours prior to surgery.  No chewable tobacco products for at least 6 hours prior to surgery.  No nicotine patches on the day of surgery.  On the morning of surgery brush your teeth with toothpaste and water, you may rinse your mouth with mouthwash if you wish. Do not swallow any toothpaste or mouthwash.  Notify your doctor if there is any change in your medical condition (cold, fever, infection).  Do not wear jewelry, make-up, hairpins, clips or nail polish.  Do not wear lotions, powders, or perfumes NO COLOGNE OR DEODORANT  Do not shave 48 hours prior to surgery.   Contacts and dentures may not be worn into surgery.  Do not bring valuables to the hospital, including drivers license, insurance or credit cards.  [facility] is not responsible for any belongings or valuables.   TAKE THESE MEDICATIONS THE MORNING OF SURGERY: OMEPRAZOLE  (take one the night before and one on the morning of surgery - helps to prevent nausea after  surgery.) ATORVASTATIN Use CHG Soap  as directed on instruction sheet.  Stop Metformin  2 days prior to surgery. LAST DOSE Friday NIGHT May 16, 2018  Follow recommendations from Cardiologist, Pulmonologist or PCP regarding stopping Aspirin Sunday 8, 2020 LAST DOSE  Stop Anti-inflammatories (NSAIDS) such as Advil, Aleve, Ibuprofen, Motrin, Naproxen, Naprosyn and Aspirin based products such as Excedrin, Goodys Powder, BC Powder. Sunday 8, 2020  (May take Tylenol or Acetaminophen if needed.)  Wear comfortable clothing (specific to your surgery type) to the hospital.  Plan for stool softeners for home use.  If you are being admitted to the hospital overnight, leave your suitcase in the car. After surgery it may be brought to your room.  If you are being discharged the day of surgery, you will not be allowed to drive home. You will need a responsible adult to drive you home and stay with you that night.   If you are taking public transportation, you will need to have a responsible adult with you. Please confirm with your physician that it is acceptable to use public transportation.   Please call [phone] if you have any questions about these instructions.

## 2018-05-08 LAB — URINE CULTURE
Culture: NO GROWTH
Special Requests: NORMAL

## 2018-05-18 MED ORDER — TRANEXAMIC ACID-NACL 1000-0.7 MG/100ML-% IV SOLN
1000.0000 mg | INTRAVENOUS | Status: DC
  Filled 2018-05-18: qty 100

## 2018-05-18 MED ORDER — CEFAZOLIN SODIUM-DEXTROSE 2-4 GM/100ML-% IV SOLN: 2.0000 g | INTRAVENOUS | Status: DC

## 2018-05-19 ENCOUNTER — Inpatient Hospital Stay: Payer: BLUE CROSS/BLUE SHIELD | Admitting: Certified Registered Nurse Anesthetist

## 2018-05-19 ENCOUNTER — Other Ambulatory Visit: Payer: Self-pay

## 2018-05-19 ENCOUNTER — Inpatient Hospital Stay: Payer: BLUE CROSS/BLUE SHIELD

## 2018-05-19 ENCOUNTER — Encounter: Payer: Self-pay | Admitting: Orthopedic Surgery

## 2018-05-19 ENCOUNTER — Inpatient Hospital Stay
Admission: RE | Admit: 2018-05-19 | Discharge: 2018-05-21 | DRG: 470 | Disposition: A | Discharge status: Home Health | Payer: BLUE CROSS/BLUE SHIELD | Attending: Orthopedic Surgery | Admitting: Orthopedic Surgery

## 2018-05-19 ENCOUNTER — Encounter
Admission: RE | Disposition: A | Discharge status: Home Health | Payer: Self-pay | Source: Home / Self Care | Attending: Orthopedic Surgery

## 2018-05-19 DIAGNOSIS — E1151 Type 2 diabetes mellitus with diabetic peripheral angiopathy without gangrene: Secondary | ICD-10-CM | POA: Diagnosis present

## 2018-05-19 DIAGNOSIS — Z7982 Long term (current) use of aspirin: Secondary | ICD-10-CM

## 2018-05-19 DIAGNOSIS — R1310 Dysphagia, unspecified: Secondary | ICD-10-CM | POA: Diagnosis present

## 2018-05-19 DIAGNOSIS — E138 Other specified diabetes mellitus with unspecified complications: Secondary | ICD-10-CM | POA: Diagnosis present

## 2018-05-19 DIAGNOSIS — I10 Essential (primary) hypertension: Secondary | ICD-10-CM | POA: Diagnosis present

## 2018-05-19 DIAGNOSIS — I6529 Occlusion and stenosis of unspecified carotid artery: Secondary | ICD-10-CM | POA: Diagnosis present

## 2018-05-19 DIAGNOSIS — Z955 Presence of coronary angioplasty implant and graft: Secondary | ICD-10-CM | POA: Diagnosis not present

## 2018-05-19 DIAGNOSIS — K219 Gastro-esophageal reflux disease without esophagitis: Secondary | ICD-10-CM | POA: Diagnosis present

## 2018-05-19 DIAGNOSIS — Z96659 Presence of unspecified artificial knee joint: Secondary | ICD-10-CM

## 2018-05-19 DIAGNOSIS — E782 Mixed hyperlipidemia: Secondary | ICD-10-CM | POA: Diagnosis present

## 2018-05-19 DIAGNOSIS — M25562 Pain in left knee: Secondary | ICD-10-CM | POA: Diagnosis present

## 2018-05-19 DIAGNOSIS — M1712 Unilateral primary osteoarthritis, left knee: Principal | ICD-10-CM | POA: Diagnosis present

## 2018-05-19 DIAGNOSIS — I2510 Atherosclerotic heart disease of native coronary artery without angina pectoris: Secondary | ICD-10-CM | POA: Diagnosis present

## 2018-05-19 DIAGNOSIS — Z7984 Long term (current) use of oral hypoglycemic drugs: Secondary | ICD-10-CM

## 2018-05-19 HISTORY — PX: KNEE ARTHROPLASTY: SHX992

## 2018-05-19 LAB — GLUCOSE, CAPILLARY
GLUCOSE-CAPILLARY: 131 mg/dL — AB (ref 70–99)
Glucose-Capillary: 179 mg/dL — ABNORMAL HIGH (ref 70–99)
Glucose-Capillary: 194 mg/dL — ABNORMAL HIGH (ref 70–99)
Glucose-Capillary: 184 mg/dL — ABNORMAL HIGH (ref 70–99)

## 2018-05-19 LAB — ABO/RH: ABO/RH(D): A POS

## 2018-05-19 SURGERY — ARTHROPLASTY, KNEE, TOTAL, USING IMAGELESS COMPUTER-ASSISTED NAVIGATION
Anesthesia: Spinal | Site: Knee | Laterality: Left

## 2018-05-19 MED ORDER — GABAPENTIN 300 MG PO CAPS
300.0000 mg | ORAL_CAPSULE | Freq: Once | ORAL | Status: AC
  Administered 2018-05-19: 300 mg via ORAL

## 2018-05-19 MED ORDER — CELECOXIB 200 MG PO CAPS
400.0000 mg | ORAL_CAPSULE | Freq: Once | ORAL | Status: AC
  Administered 2018-05-19: 400 mg via ORAL

## 2018-05-19 MED ORDER — TRANEXAMIC ACID-NACL 1000-0.7 MG/100ML-% IV SOLN
1000.0000 mg | Freq: Once | INTRAVENOUS | Status: AC
  Administered 2018-05-19: 1000 mg via INTRAVENOUS
  Filled 2018-05-19: qty 100

## 2018-05-19 MED ORDER — LIDOCAINE HCL (PF) 2 % IJ SOLN
INTRAMUSCULAR | Status: AC
  Filled 2018-05-19: qty 10

## 2018-05-19 MED ORDER — KETAMINE HCL 50 MG/ML IJ SOLN
INTRAMUSCULAR | Status: DC | PRN
  Administered 2018-05-19: 35 mg via INTRAMUSCULAR

## 2018-05-19 MED ORDER — TRANEXAMIC ACID-NACL 1000-0.7 MG/100ML-% IV SOLN
INTRAVENOUS | Status: DC | PRN
  Administered 2018-05-19: 1000 mg via INTRAVENOUS

## 2018-05-19 MED ORDER — SENNOSIDES-DOCUSATE SODIUM 8.6-50 MG PO TABS
1.0000 | ORAL_TABLET | Freq: Two times a day (BID) | ORAL | Status: DC
  Administered 2018-05-19 – 2018-05-20 (×3): 1 via ORAL
  Filled 2018-05-19 (×3): qty 1

## 2018-05-19 MED ORDER — METOCLOPRAMIDE HCL 10 MG PO TABS
10.0000 mg | ORAL_TABLET | Freq: Three times a day (TID) | ORAL | Status: DC
  Administered 2018-05-19 – 2018-05-21 (×6): 10 mg via ORAL
  Filled 2018-05-19 (×6): qty 1

## 2018-05-19 MED ORDER — METOCLOPRAMIDE HCL 5 MG/ML IJ SOLN: 5.0000 mg | Freq: Three times a day (TID) | INTRAMUSCULAR | Status: DC | PRN

## 2018-05-19 MED ORDER — TRAMADOL HCL 50 MG PO TABS
50.0000 mg | ORAL_TABLET | ORAL | Status: DC | PRN
  Administered 2018-05-21: 100 mg via ORAL
  Filled 2018-05-19: qty 2

## 2018-05-19 MED ORDER — HYDROMORPHONE HCL 1 MG/ML IJ SOLN: 0.5000 mg | INTRAMUSCULAR | Status: DC | PRN

## 2018-05-19 MED ORDER — DIPHENHYDRAMINE HCL 12.5 MG/5ML PO ELIX: 12.5000 mg | ORAL_SOLUTION | ORAL | Status: DC | PRN

## 2018-05-19 MED ORDER — GLYCOPYRROLATE 0.2 MG/ML IJ SOLN
INTRAMUSCULAR | Status: DC | PRN
  Administered 2018-05-19: 0.2 mg via INTRAVENOUS

## 2018-05-19 MED ORDER — BUPIVACAINE HCL (PF) 0.5 % IJ SOLN
INTRAMUSCULAR | Status: AC
  Filled 2018-05-19: qty 10

## 2018-05-19 MED ORDER — ALUM & MAG HYDROXIDE-SIMETH 200-200-20 MG/5ML PO SUSP: 30.0000 mL | ORAL | Status: DC | PRN

## 2018-05-19 MED ORDER — KETAMINE HCL 50 MG/ML IJ SOLN
INTRAMUSCULAR | Status: AC
  Filled 2018-05-19: qty 10

## 2018-05-19 MED ORDER — FERROUS SULFATE 325 (65 FE) MG PO TABS
325.0000 mg | ORAL_TABLET | Freq: Two times a day (BID) | ORAL | Status: DC
  Administered 2018-05-20 – 2018-05-21 (×3): 325 mg via ORAL
  Filled 2018-05-19 (×3): qty 1

## 2018-05-19 MED ORDER — ACETAMINOPHEN 10 MG/ML IV SOLN
INTRAVENOUS | Status: AC
  Filled 2018-05-19: qty 100

## 2018-05-19 MED ORDER — PROPOFOL 10 MG/ML IV BOLUS
INTRAVENOUS | Status: DC | PRN
  Administered 2018-05-19: 30 mg via INTRAVENOUS
  Administered 2018-05-19 (×3): 17 mg via INTRAVENOUS

## 2018-05-19 MED ORDER — BUPIVACAINE HCL (PF) 0.25 % IJ SOLN
INTRAMUSCULAR | Status: AC
  Filled 2018-05-19: qty 60

## 2018-05-19 MED ORDER — FENTANYL CITRATE (PF) 100 MCG/2ML IJ SOLN
INTRAMUSCULAR | Status: DC | PRN
  Administered 2018-05-19: 50 ug via INTRAVENOUS

## 2018-05-19 MED ORDER — ACETAMINOPHEN 10 MG/ML IV SOLN
1000.0000 mg | Freq: Four times a day (QID) | INTRAVENOUS | Status: AC
  Administered 2018-05-19 – 2018-05-20 (×4): 1000 mg via INTRAVENOUS
  Filled 2018-05-19 (×4): qty 100

## 2018-05-19 MED ORDER — SODIUM CHLORIDE 0.9 % IV SOLN
INTRAVENOUS | Status: DC | PRN
  Administered 2018-05-19: 15 ug/min via INTRAVENOUS
  Administered 2018-05-19: 30 ug/min via INTRAVENOUS

## 2018-05-19 MED ORDER — CEFAZOLIN SODIUM-DEXTROSE 2-4 GM/100ML-% IV SOLN
2.0000 g | Freq: Four times a day (QID) | INTRAVENOUS | Status: AC
  Administered 2018-05-19 – 2018-05-20 (×4): 2 g via INTRAVENOUS
  Filled 2018-05-19 (×4): qty 100

## 2018-05-19 MED ORDER — METFORMIN HCL 500 MG PO TABS
500.0000 mg | ORAL_TABLET | Freq: Every day | ORAL | Status: DC
  Administered 2018-05-19 – 2018-05-20 (×2): 500 mg via ORAL
  Filled 2018-05-19 (×2): qty 1

## 2018-05-19 MED ORDER — GABAPENTIN 300 MG PO CAPS
ORAL_CAPSULE | ORAL | Status: AC
  Administered 2018-05-19: 300 mg via ORAL
  Filled 2018-05-19: qty 1

## 2018-05-19 MED ORDER — SODIUM CHLORIDE FLUSH 0.9 % IV SOLN
INTRAVENOUS | Status: AC
  Filled 2018-05-19: qty 40

## 2018-05-19 MED ORDER — ENOXAPARIN SODIUM 30 MG/0.3ML ~~LOC~~ SOLN
30.0000 mg | Freq: Two times a day (BID) | SUBCUTANEOUS | Status: DC
  Administered 2018-05-20 – 2018-05-21 (×3): 30 mg via SUBCUTANEOUS
  Filled 2018-05-19 (×3): qty 0.3

## 2018-05-19 MED ORDER — NITROGLYCERIN 0.4 MG SL SUBL: 0.4000 mg | SUBLINGUAL_TABLET | SUBLINGUAL | Status: DC | PRN

## 2018-05-19 MED ORDER — LISINOPRIL 20 MG PO TABS
20.0000 mg | ORAL_TABLET | Freq: Every day | ORAL | Status: DC
  Administered 2018-05-20 – 2018-05-21 (×2): 20 mg via ORAL
  Filled 2018-05-19 (×2): qty 1

## 2018-05-19 MED ORDER — FENTANYL CITRATE (PF) 100 MCG/2ML IJ SOLN
INTRAMUSCULAR | Status: AC
  Filled 2018-05-19: qty 2

## 2018-05-19 MED ORDER — ONDANSETRON HCL 4 MG/2ML IJ SOLN
4.0000 mg | Freq: Four times a day (QID) | INTRAMUSCULAR | Status: DC | PRN
  Administered 2018-05-20: 4 mg via INTRAVENOUS
  Filled 2018-05-19: qty 2

## 2018-05-19 MED ORDER — PHENOL 1.4 % MT LIQD
1.0000 | OROMUCOSAL | Status: DC | PRN
  Filled 2018-05-19: qty 177

## 2018-05-19 MED ORDER — OXYCODONE HCL 5 MG PO TABS
10.0000 mg | ORAL_TABLET | ORAL | Status: DC | PRN
  Administered 2018-05-21: 10 mg via ORAL
  Filled 2018-05-19: qty 2

## 2018-05-19 MED ORDER — DEXAMETHASONE SODIUM PHOSPHATE 10 MG/ML IJ SOLN
INTRAMUSCULAR | Status: AC
  Administered 2018-05-19: 8 mg via INTRAVENOUS
  Filled 2018-05-19: qty 1

## 2018-05-19 MED ORDER — ACETAMINOPHEN 10 MG/ML IV SOLN
INTRAVENOUS | Status: DC | PRN
  Administered 2018-05-19: 1000 mg via INTRAVENOUS

## 2018-05-19 MED ORDER — SODIUM CHLORIDE 0.9 % IV SOLN
INTRAVENOUS | Status: DC
  Administered 2018-05-19 – 2018-05-20 (×2): via INTRAVENOUS

## 2018-05-19 MED ORDER — ACETAMINOPHEN 325 MG PO TABS: 325.0000 mg | ORAL_TABLET | Freq: Four times a day (QID) | ORAL | Status: DC | PRN

## 2018-05-19 MED ORDER — MENTHOL 3 MG MT LOZG
1.0000 | LOZENGE | OROMUCOSAL | Status: DC | PRN
  Filled 2018-05-19: qty 9

## 2018-05-19 MED ORDER — ONDANSETRON HCL 4 MG PO TABS: 4.0000 mg | ORAL_TABLET | Freq: Four times a day (QID) | ORAL | Status: DC | PRN

## 2018-05-19 MED ORDER — GABAPENTIN 300 MG PO CAPS
300.0000 mg | ORAL_CAPSULE | Freq: Every day | ORAL | Status: DC
  Administered 2018-05-19 – 2018-05-20 (×3): 300 mg via ORAL
  Filled 2018-05-19 (×2): qty 1

## 2018-05-19 MED ORDER — INSULIN ASPART 100 UNIT/ML ~~LOC~~ SOLN
0.0000 [IU] | Freq: Three times a day (TID) | SUBCUTANEOUS | Status: DC
  Administered 2018-05-19: 3 [IU] via SUBCUTANEOUS
  Administered 2018-05-20 – 2018-05-21 (×4): 2 [IU] via SUBCUTANEOUS
  Filled 2018-05-19 (×5): qty 1

## 2018-05-19 MED ORDER — PROPOFOL 500 MG/50ML IV EMUL
INTRAVENOUS | Status: DC | PRN
  Administered 2018-05-19: 60 ug/kg/min via INTRAVENOUS

## 2018-05-19 MED ORDER — SODIUM CHLORIDE 0.9 % IV SOLN
INTRAVENOUS | Status: DC
  Administered 2018-05-19: 10:00:00 via INTRAVENOUS
  Administered 2018-05-19: 50 mL/h via INTRAVENOUS

## 2018-05-19 MED ORDER — DEXAMETHASONE SODIUM PHOSPHATE 10 MG/ML IJ SOLN
8.0000 mg | Freq: Once | INTRAMUSCULAR | Status: AC
  Administered 2018-05-19: 8 mg via INTRAVENOUS

## 2018-05-19 MED ORDER — NEOMYCIN-POLYMYXIN B GU 40-200000 IR SOLN
Status: DC | PRN
  Administered 2018-05-19: 14 mL

## 2018-05-19 MED ORDER — METFORMIN HCL 500 MG PO TABS: 500.0000 mg | ORAL_TABLET | Freq: Two times a day (BID) | ORAL | Status: DC

## 2018-05-19 MED ORDER — LABETALOL HCL 5 MG/ML IV SOLN
INTRAVENOUS | Status: DC | PRN
  Administered 2018-05-19: 10 mg via INTRAVENOUS

## 2018-05-19 MED ORDER — NEOMYCIN-POLYMYXIN B GU 40-200000 IR SOLN
Status: AC
  Filled 2018-05-19: qty 1

## 2018-05-19 MED ORDER — CHLORHEXIDINE GLUCONATE 4 % EX LIQD: 60.0000 mL | Freq: Once | CUTANEOUS | Status: DC

## 2018-05-19 MED ORDER — GLYCOPYRROLATE 0.2 MG/ML IJ SOLN
INTRAMUSCULAR | Status: AC
  Filled 2018-05-19: qty 1

## 2018-05-19 MED ORDER — BUPIVACAINE HCL (PF) 0.5 % IJ SOLN
INTRAMUSCULAR | Status: DC | PRN
  Administered 2018-05-19: 2.7 mL

## 2018-05-19 MED ORDER — METOCLOPRAMIDE HCL 10 MG PO TABS: 5.0000 mg | ORAL_TABLET | Freq: Three times a day (TID) | ORAL | Status: DC | PRN

## 2018-05-19 MED ORDER — PROPOFOL 500 MG/50ML IV EMUL
INTRAVENOUS | Status: AC
  Filled 2018-05-19: qty 50

## 2018-05-19 MED ORDER — FLEET ENEMA 7-19 GM/118ML RE ENEM: 1.0000 | ENEMA | Freq: Once | RECTAL | Status: DC | PRN

## 2018-05-19 MED ORDER — MIDAZOLAM HCL 2 MG/2ML IJ SOLN
INTRAMUSCULAR | Status: AC
  Filled 2018-05-19: qty 2

## 2018-05-19 MED ORDER — PANTOPRAZOLE SODIUM 40 MG PO TBEC
40.0000 mg | DELAYED_RELEASE_TABLET | Freq: Two times a day (BID) | ORAL | Status: DC
  Administered 2018-05-19 – 2018-05-21 (×4): 40 mg via ORAL
  Filled 2018-05-19 (×4): qty 1

## 2018-05-19 MED ORDER — TETRACAINE HCL 1 % IJ SOLN
INTRAMUSCULAR | Status: DC | PRN
  Administered 2018-05-19: 3 mg via INTRASPINAL

## 2018-05-19 MED ORDER — CELECOXIB 200 MG PO CAPS
ORAL_CAPSULE | ORAL | Status: AC
  Administered 2018-05-19: 400 mg via ORAL
  Filled 2018-05-19: qty 2

## 2018-05-19 MED ORDER — BISACODYL 10 MG RE SUPP: 10.0000 mg | Freq: Every day | RECTAL | Status: DC | PRN

## 2018-05-19 MED ORDER — SODIUM CHLORIDE 0.9 % IV SOLN
INTRAVENOUS | Status: DC | PRN
  Administered 2018-05-19: 60 mL

## 2018-05-19 MED ORDER — CEFAZOLIN SODIUM-DEXTROSE 2-4 GM/100ML-% IV SOLN
INTRAVENOUS | Status: AC
  Filled 2018-05-19: qty 100

## 2018-05-19 MED ORDER — CEFAZOLIN SODIUM-DEXTROSE 2-3 GM-%(50ML) IV SOLR
INTRAVENOUS | Status: DC | PRN
  Administered 2018-05-19: 2 g via INTRAVENOUS

## 2018-05-19 MED ORDER — ATORVASTATIN CALCIUM 20 MG PO TABS
40.0000 mg | ORAL_TABLET | Freq: Every day | ORAL | Status: DC
  Administered 2018-05-20 – 2018-05-21 (×2): 40 mg via ORAL
  Filled 2018-05-19 (×2): qty 2

## 2018-05-19 MED ORDER — MAGNESIUM HYDROXIDE 400 MG/5ML PO SUSP: 30.0000 mL | Freq: Every day | ORAL | Status: DC

## 2018-05-19 MED ORDER — BUPIVACAINE LIPOSOME 1.3 % IJ SUSP
INTRAMUSCULAR | Status: AC
  Filled 2018-05-19: qty 20

## 2018-05-19 MED ORDER — PHENYLEPHRINE HCL 10 MG/ML IJ SOLN
INTRAMUSCULAR | Status: AC
  Filled 2018-05-19: qty 2

## 2018-05-19 MED ORDER — BUPIVACAINE HCL (PF) 0.25 % IJ SOLN
INTRAMUSCULAR | Status: DC | PRN
  Administered 2018-05-19: 60 mL

## 2018-05-19 MED ORDER — MIDAZOLAM HCL 5 MG/5ML IJ SOLN
INTRAMUSCULAR | Status: DC | PRN
  Administered 2018-05-19: 2 mg via INTRAVENOUS

## 2018-05-19 MED ORDER — OXYCODONE HCL 5 MG PO TABS
5.0000 mg | ORAL_TABLET | ORAL | Status: DC | PRN
  Administered 2018-05-19 – 2018-05-21 (×10): 5 mg via ORAL
  Filled 2018-05-19 (×10): qty 1

## 2018-05-19 MED ORDER — METFORMIN HCL 500 MG PO TABS
1000.0000 mg | ORAL_TABLET | Freq: Every day | ORAL | Status: DC
  Administered 2018-05-20 – 2018-05-21 (×2): 1000 mg via ORAL
  Filled 2018-05-19 (×2): qty 2

## 2018-05-19 MED ORDER — CELECOXIB 200 MG PO CAPS
200.0000 mg | ORAL_CAPSULE | Freq: Two times a day (BID) | ORAL | Status: DC
  Administered 2018-05-19 – 2018-05-21 (×4): 200 mg via ORAL
  Filled 2018-05-19 (×4): qty 1

## 2018-05-19 SURGICAL SUPPLY — 70 items
ATTUNE MED DOME PAT 41 KNEE (Knees) ×2 IMPLANT
ATTUNE MED DOME PAT 41MM KNEE (Knees) ×1 IMPLANT
ATTUNE PS FEM LT SZ 6 CEM KNEE (Femur) ×3 IMPLANT
ATTUNE PSRP INSR SZ6 6 KNEE (Insert) ×2 IMPLANT
ATTUNE PSRP INSR SZ6 6MM KNEE (Insert) ×1 IMPLANT
BASE TIBIAL ROT PLAT SZ 7 KNEE (Knees) ×1 IMPLANT
BATTERY INSTRU NAVIGATION (MISCELLANEOUS) ×12 IMPLANT
BLADE SAW 70X12.5 (BLADE) ×3 IMPLANT
BLADE SAW 90X13X1.19 OSCILLAT (BLADE) ×3 IMPLANT
BLADE SAW 90X25X1.19 OSCILLAT (BLADE) ×3 IMPLANT
BONE CEMENT GENTAMICIN (Cement) ×6 IMPLANT
CANISTER SUCT 3000ML PPV (MISCELLANEOUS) ×3 IMPLANT
CEMENT BONE GENTAMICIN 40 (Cement) ×2 IMPLANT
COOLER POLAR GLACIER W/PUMP (MISCELLANEOUS) ×3 IMPLANT
COVER WAND RF STERILE (DRAPES) ×3 IMPLANT
CUFF TOURN SGL QUICK 24 (TOURNIQUET CUFF) ×2
CUFF TOURN SGL QUICK 30 (TOURNIQUET CUFF)
CUFF TRNQT CYL 24X4X16.5-23 (TOURNIQUET CUFF) ×1 IMPLANT
CUFF TRNQT CYL 30X4X21-28X (TOURNIQUET CUFF) IMPLANT
DRAPE SHEET LG 3/4 BI-LAMINATE (DRAPES) ×3 IMPLANT
DRSG DERMACEA 8X12 NADH (GAUZE/BANDAGES/DRESSINGS) ×3 IMPLANT
DRSG OPSITE POSTOP 4X14 (GAUZE/BANDAGES/DRESSINGS) ×3 IMPLANT
DRSG TEGADERM 4X4.75 (GAUZE/BANDAGES/DRESSINGS) ×3 IMPLANT
DURAPREP 26ML APPLICATOR (WOUND CARE) ×6 IMPLANT
ELECT REM PT RETURN 9FT ADLT (ELECTROSURGICAL) ×3
ELECTRODE REM PT RTRN 9FT ADLT (ELECTROSURGICAL) ×1 IMPLANT
EX-PIN ORTHOLOCK NAV 4X150 (PIN) ×6 IMPLANT
GLOVE BIOGEL M STRL SZ7.5 (GLOVE) ×6 IMPLANT
GLOVE INDICATOR 8.0 STRL GRN (GLOVE) ×3 IMPLANT
GOWN STRL REUS W/ TWL LRG LVL3 (GOWN DISPOSABLE) ×2 IMPLANT
GOWN STRL REUS W/TWL LRG LVL3 (GOWN DISPOSABLE) ×4
HEMOVAC 400CC 10FR (MISCELLANEOUS) ×3 IMPLANT
HOLDER FOLEY CATH W/STRAP (MISCELLANEOUS) ×3 IMPLANT
HOOD PEEL AWAY FLYTE STAYCOOL (MISCELLANEOUS) ×6 IMPLANT
KIT TURNOVER KIT A (KITS) ×3 IMPLANT
KNIFE SCULPS 14X20 (INSTRUMENTS) ×3 IMPLANT
LABEL OR SOLS (LABEL) ×3 IMPLANT
MANIFOLD NEPTUNE WASTE (CANNULA) ×3 IMPLANT
NDL SAFETY ECLIPSE 18X1.5 (NEEDLE) ×1 IMPLANT
NEEDLE HYPO 18GX1.5 SHARP (NEEDLE) ×2
NEEDLE SPNL 20GX3.5 QUINCKE YW (NEEDLE) ×6 IMPLANT
NS IRRIG 500ML POUR BTL (IV SOLUTION) ×3 IMPLANT
PACK TOTAL KNEE (MISCELLANEOUS) ×3 IMPLANT
PAD WRAPON POLAR KNEE (MISCELLANEOUS) ×1 IMPLANT
PENCIL SMOKE ULTRAEVAC 22 CON (MISCELLANEOUS) ×3 IMPLANT
PIN DRILL QUICK PACK ×3 IMPLANT
PIN FIXATION 1/8DIA X 3INL (PIN) ×9 IMPLANT
PULSAVAC PLUS IRRIG FAN TIP (DISPOSABLE) ×3
SOL .9 NS 3000ML IRR  AL (IV SOLUTION) ×2
SOL .9 NS 3000ML IRR UROMATIC (IV SOLUTION) ×1 IMPLANT
SOL PREP PVP 2OZ (MISCELLANEOUS) ×3
SOLUTION PREP PVP 2OZ (MISCELLANEOUS) ×1 IMPLANT
SPONGE DRAIN TRACH 4X4 STRL 2S (GAUZE/BANDAGES/DRESSINGS) ×3 IMPLANT
SPONGE LAP 18X18 RF (DISPOSABLE) ×3 IMPLANT
STAPLER SKIN PROX 35W (STAPLE) ×3 IMPLANT
STOCKINETTE IMPERV 14X48 (MISCELLANEOUS) IMPLANT
STRAP TIBIA SHORT (MISCELLANEOUS) ×3 IMPLANT
SUCTION FRAZIER HANDLE 10FR (MISCELLANEOUS) ×2
SUCTION TUBE FRAZIER 10FR DISP (MISCELLANEOUS) ×1 IMPLANT
SUT VIC AB 0 CT1 36 (SUTURE) ×3 IMPLANT
SUT VIC AB 1 CT1 36 (SUTURE) ×6 IMPLANT
SUT VIC AB 2-0 CT2 27 (SUTURE) ×3 IMPLANT
SYR 20CC LL (SYRINGE) ×3 IMPLANT
SYR 30ML LL (SYRINGE) ×6 IMPLANT
TIBIAL BASE ROT PLAT SZ 7 KNEE (Knees) ×3 IMPLANT
TIP FAN IRRIG PULSAVAC PLUS (DISPOSABLE) ×1 IMPLANT
TOWEL OR 17X26 4PK STRL BLUE (TOWEL DISPOSABLE) ×3 IMPLANT
TOWER CARTRIDGE SMART MIX (DISPOSABLE) ×3 IMPLANT
TRAY FOLEY MTR SLVR 16FR STAT (SET/KITS/TRAYS/PACK) ×3 IMPLANT
WRAPON POLAR PAD KNEE (MISCELLANEOUS) ×3

## 2018-05-19 NOTE — Care Management (Signed)
Physical Address for the patient is [place] [address], sent to Helene Kelp with [facility] thru secure email

## 2018-05-19 NOTE — H&P (Signed)
The patient has been re-examined, and the chart reviewed, and there have been no interval changes to the documented history and physical.    The risks, benefits, and alternatives have been discussed at length. The patient expressed understanding of the risks benefits and agreed with plans for surgical intervention.  James P. Hooten, Jr. M.D.    

## 2018-05-19 NOTE — TOC Initial Note (Signed)
Transition of Care (TOC) - Initial/Assessment Note  Met with the patient and his wife and discussed DC plan and needs, The patient lives in a single story house with 3 steps to get inside.  The patient has a RW at home but needs a 3 in 1, notified Brad with [employer] via secure email.  The patient was given the CMS choice list for [facility] agencies and chose [facility], I notified Helene Kelp thru secure email.  I agreed to get the patients physical address for [facility] to see the patient.  The price of $13.05 for the lovenox was given to the patient.  No further needs  Patient Details  Name: Jeremy Luna MRN: [id number] Date of Birth: 1956-02-05  Transition of Care [facility]) CM/SW Contact:    Su Hilt, RN Phone Number: 05/19/2018, 2:32 PM  Clinical Narrative:                   Expected Discharge Plan: [place] Barriers to Discharge: Continued Medical Work up   Patient Goals and CMS Choice Patient states their goals for this hospitalization and ongoing recovery are:: go home CMS Medicare.gov Compare Post Acute Care list provided to:: Patient Choice offered to / list presented to : Patient  Expected Discharge Plan and Services Expected Discharge Plan: [place] Discharge Planning Services: CM Consult Post Acute Care Choice: [place] arrangements for the past 2 months: Single Family Home                 DME Arranged: 3-N-1 DME Agency: [facility] HH Arranged: PT [place]: [facility]now [facility])  Prior Living Arrangements/Services Living arrangements for the past 2 months: [place] Lives with:: Spouse Patient language and need for interpreter reviewed:: Yes Do you feel safe going back to the place where you live?: Yes      Need for Family Participation in Patient Care: No (Comment) Care giver support system in place?: Yes (comment)   Criminal Activity/Legal Involvement Pertinent to Current  Situation/Hospitalization: No - Comment as needed  Activities of Daily Living Home Assistive Devices/Equipment: CBG Meter, Eyeglasses ADL Screening (condition at time of admission) Patient's cognitive ability adequate to safely complete daily activities?: Yes Is the patient deaf or have difficulty hearing?: No Does the patient have difficulty seeing, even when wearing glasses/contacts?: No Does the patient have difficulty concentrating, remembering, or making decisions?: No Patient able to express need for assistance with ADLs?: Yes Does the patient have difficulty dressing or bathing?: No Independently performs ADLs?: Yes (appropriate for developmental age) Does the patient have difficulty walking or climbing stairs?: No Weakness of Legs: Left Weakness of Arms/Hands: None  Permission Sought/Granted Permission sought to share information with : Case Manager Permission granted to share information with : Yes, Verbal Permission Granted     Permission granted to share info w AGENCY: [facility]        Emotional Assessment Appearance:: Appears stated age Attitude/Demeanor/Rapport: Engaged Affect (typically observed): Accepting Orientation: : Oriented to Self, Oriented to Place, Oriented to  Time, Oriented to Situation Alcohol / Substance Use: Never Used Psych Involvement: No (comment)  Admission diagnosis:  PRIMARY OSTEOARTHRITIS OF LEFT KNEE Patient Active Problem List   Diagnosis Date Noted  . Total knee replacement status 05/19/2018  . DM type 2 with diabetic mixed hyperlipidemia ([place]) 10/28/2017  . Primary osteoarthritis of left knee 04/30/2017  . Hyperlipidemia, mixed 04/08/2017  . Schatzki's ring 12/10/2016  .  Symptomatic cholelithiasis   . Other dysphagia 06/22/2016  . Benign essential hypertension 04/02/2016  . Tubular adenoma 04/02/2016  . Diabetes mellitus type 2, controlled, without complications ([place]) 21/30/8657  . Leg pain 07/27/2013  . GERD (gastroesophageal reflux  disease) 08/21/2011  . Coronary artery disease 12/15/2010  . Dyslipidemia 12/15/2010   PCP:  Rusty Aus, MD Pharmacy:   [id number] [place] [address]Phone: [phone]: [phone]    Social Determinants of Health (SDOH) Interventions    Readmission Risk Interventions 30 Day Unplanned Readmission Risk Score     [facility] [place] [place]  30 Day Unplanned Readmission Risk Score (%)  4 Filed at 05/19/2018 1200     This score is the patient's risk of an unplanned readmission within 30 days of being discharged (0 -100%). The score is based on dignosis, age, lab data, medications, orders, and past utilization.   Low:  0-14.9   Medium: 15-21.9   High: 22-29.9   Extreme: 30 and above       No flowsheet data found.

## 2018-05-19 NOTE — Evaluation (Signed)
Physical Therapy Evaluation Patient Details Name: Jeremy Luna MRN: [id number] DOB: 1955/07/29 Today's Date: 05/19/2018   History of Present Illness  Patient is 63 yo male POD #0 L TKA, WBAT. PMH of HTN, CAD, PVD, cardiac stents, angioplasty, GERD, DM.  Clinical Impression  Patient assessed at start of session, no reports of pain, patient reported being able to feel PT  Touching L foot, shin, and quad though decreased. Able to wiggle toes and move LLE. Patient reported prior to admission being independent, working full time, lives in one story house with wife who will be available to assist 24/7 as needed.  Patient was able to perform bed level therapeutic exercises with verbal and tactile cues and very little physical assist. Mobilized to EOB with supervision to perform seated knee flexion. ROM this session 0-75deg. PT, pt and family reviewed PT role and expectations of stay as well. OOB mobility held this session due to safety concerns.  Overall the patient demonstrated deficits (see "PT Problem List") that impede the patient's functional abilities, safety, and mobility and would benefit from skilled PT intervention. Recommendation is HHPT with supervision for mobility/OOB pending progress.      Follow Up Recommendations Home health PT;Supervision for mobility/OOB    Equipment Recommendations  3in1 (PT);Other (comment)(Pt has RW at home)    Recommendations for Other Services       Precautions / Restrictions Precautions Precautions: Fall;Knee Precaution Booklet Issued: No Restrictions Weight Bearing Restrictions: Yes LLE Weight Bearing: Weight bearing as tolerated      Mobility  Bed Mobility Overal bed mobility: Needs Assistance Bed Mobility: Supine to Sit;Sit to Supine     Supine to sit: Supervision Sit to supine: Supervision      Transfers                 General transfer comment: deferred due to patient's ongoing sensation deficits  Ambulation/Gait             General Gait Details: deferred due to patient's ongoing sensation deficits  Stairs            Wheelchair Mobility    Modified Rankin (Stroke Patients Only)       Balance Overall balance assessment: Needs assistance Sitting-balance support: Feet supported Sitting balance-Leahy Scale: Good                                       Pertinent Vitals/Pain Pain Assessment: No/denies pain    Home Living Family/patient expects to be discharged to:: Private residence Living Arrangements: Spouse/significant other Available Help at Discharge: Family;Available 24 hours/day Type of Home: House Home Access: Stairs to enter Entrance Stairs-Rails: None Entrance Stairs-Number of Steps: 3 Home Layout: One level Home Equipment: Environmental consultant - 2 wheels      Prior Function Level of Independence: Independent               Hand Dominance   Dominant Hand: Right    Extremity/Trunk Assessment   Upper Extremity Assessment Upper Extremity Assessment: Overall WFL for tasks assessed    Lower Extremity Assessment Lower Extremity Assessment: RLE deficits/detail;LLE deficits/detail RLE Deficits / Details: Pt able to demonstrate SLR on RLE LLE Deficits / Details: limitations expected after TKA        Communication   Communication: No difficulties  Cognition Arousal/Alertness: Awake/alert Behavior During Therapy: WFL for tasks assessed/performed Overall Cognitive Status: Within Functional Limits for tasks assessed  General Comments      Exercises Total Joint Exercises Ankle Circles/Pumps: AROM;10 reps;Both Quad Sets: AROM;10 reps;Left Gluteal Sets: AROM;Both;10 reps Short Arc Quad: AROM;Left;10 reps Hip ABduction/ADduction: AAROM;Left;10 reps Knee Flexion: AROM;Left;10 reps Goniometric ROM: L knee extension:0deg, L knee flexion 75deg   Assessment/Plan    PT Assessment Patient needs continued PT  services  PT Problem List Decreased strength;Pain;Decreased range of motion;Decreased activity tolerance;Decreased knowledge of use of DME;Decreased balance;Decreased mobility;Decreased knowledge of precautions       PT Treatment Interventions DME instruction;Therapeutic exercise;Gait training;Balance training;Stair training;Neuromuscular re-education;Functional mobility training;Therapeutic activities;Patient/family education    PT Goals (Current goals can be found in the Care Plan section)  Acute Rehab PT Goals Patient Stated Goal: to return to PLOF PT Goal Formulation: With patient Time For Goal Achievement: 06/02/18 Potential to Achieve Goals: Good    Frequency BID   Barriers to discharge        Co-evaluation               AM-PAC PT "6 Clicks" Mobility  Outcome Measure Help needed turning from your back to your side while in a flat bed without using bedrails?: A Little Help needed moving from lying on your back to sitting on the side of a flat bed without using bedrails?: A Little Help needed moving to and from a bed to a chair (including a wheelchair)?: A Little Help needed standing up from a chair using your arms (e.g., wheelchair or bedside chair)?: A Little Help needed to walk in hospital room?: A Little Help needed climbing 3-5 steps with a railing? : A Little 6 Click Score: 18    End of Session   Activity Tolerance: Patient tolerated treatment well Patient left: in bed;with call bell/phone within reach;with SCD's reapplied;with family/visitor present;Other (comment);with bed alarm set(polar care in place, heels elevated, bone foam in place on LLE) Nurse Communication: Mobility status PT Visit Diagnosis: Difficulty in walking, not elsewhere classified (R26.2);Muscle weakness (generalized) (M62.81);Pain;Other abnormalities of gait and mobility (R26.89) Pain - Right/Left: Left Pain - part of body: Knee    Time: 7619-5093 PT Time Calculation (min) (ACUTE ONLY):  46 min   Charges:   PT Evaluation $PT Eval Low Complexity: 1 Low PT Treatments $Therapeutic Exercise: 23-37 mins       Lieutenant Diego PT, DPT 3:47 PM,05/19/18 [phone]

## 2018-05-19 NOTE — Op Note (Signed)
OPERATIVE NOTE  DATE OF SURGERY:  05/19/2018  PATIENT NAME:  Jeremy Luna   DOB: [birth year]  MRN: [id number]  PRE-OPERATIVE DIAGNOSIS: Degenerative arthrosis of the left knee, primary  POST-OPERATIVE DIAGNOSIS:  Same  PROCEDURE:  Left total knee arthroplasty using computer-assisted navigation  SURGEON:  Marciano Sequin. M.D.  ASSISTANT: Melissa Noon, PA-C (present and scrubbed throughout the case, critical for assistance with exposure, retraction, instrumentation, and closure)  ANESTHESIA: spinal  ESTIMATED BLOOD LOSS: 50 mL  FLUIDS REPLACED: 1200 mL of crystalloid  TOURNIQUET TIME: 95 minutes  DRAINS: 2 medium Hemovac drains  SOFT TISSUE RELEASES: Anterior cruciate ligament, posterior cruciate ligament, deep medial collateral ligament, patellofemoral ligament  IMPLANTS UTILIZED: DePuy Attune size 6 posterior stabilized femoral component (cemented), size 7 rotating platform tibial component (cemented), 41 mm medialized dome patella (cemented), and a 6 mm stabilized rotating platform polyethylene insert.  INDICATIONS FOR SURGERY: Jeremy Luna is a 63 y.o. year old male with a long history of progressive knee pain. X-rays demonstrated severe degenerative changes in tricompartmental fashion. The patient had not seen any significant improvement despite conservative nonsurgical intervention. After discussion of the risks and benefits of surgical intervention, the patient expressed understanding of the risks benefits and agree with plans for total knee arthroplasty.   The risks, benefits, and alternatives were discussed at length including but not limited to the risks of infection, bleeding, nerve injury, stiffness, blood clots, the need for revision surgery, cardiopulmonary complications, among others, and they were willing to proceed.  PROCEDURE IN DETAIL: The patient was brought into the operating room and, after adequate spinal anesthesia was achieved, a tourniquet was  placed on the patient's upper thigh. The patient's knee and leg were cleaned and prepped with alcohol and DuraPrep and draped in the usual sterile fashion. A "timeout" was performed as per usual protocol. The lower extremity was exsanguinated using an Esmarch, and the tourniquet was inflated to 300 mmHg. An anterior longitudinal incision was made followed by a standard mid vastus approach. The deep fibers of the medial collateral ligament were elevated in a subperiosteal fashion off of the medial flare of the tibia so as to maintain a continuous soft tissue sleeve. The patella was subluxed laterally and the patellofemoral ligament was incised. Inspection of the knee demonstrated severe degenerative changes with full-thickness loss of articular cartilage. Osteophytes were debrided using a rongeur. Anterior and posterior cruciate ligaments were excised. Two 4.0 mm Schanz pins were inserted in the femur and into the tibia for attachment of the array of trackers used for computer-assisted navigation. Hip center was identified using a circumduction technique. Distal landmarks were mapped using the computer. The distal femur and proximal tibia were mapped using the computer. The distal femoral cutting guide was positioned using computer-assisted navigation so as to achieve a 5 distal valgus cut. The femur was sized and it was felt that a size 6 femoral component was appropriate. A size 6 femoral cutting guide was positioned and the anterior cut was performed and verified using the computer. This was followed by completion of the posterior and chamfer cuts. Femoral cutting guide for the central box was then positioned in the center box cut was performed.  Attention was then directed to the proximal tibia. Medial and lateral menisci were excised. The extramedullary tibial cutting guide was positioned using computer-assisted navigation so as to achieve a 0 varus-valgus alignment and 3 posterior slope. The cut was  performed and verified using the computer. The proximal tibia was  sized and it was felt that a size 7 tibial tray was appropriate. Tibial and femoral trials were inserted followed by insertion of a 6 mm polyethylene insert. This allowed for excellent mediolateral soft tissue balancing both in flexion and in full extension. Finally, the patella was cut and prepared so as to accommodate a 41 mm medialized dome patella. A patella trial was placed and the knee was placed through a range of motion with excellent patellar tracking appreciated. The femoral trial was removed after debridement of posterior osteophytes. The central post-hole for the tibial component was reamed followed by insertion of a keel punch. Tibial trials were then removed. Cut surfaces of bone were irrigated with copious amounts of normal saline with antibiotic solution using pulsatile lavage and then suctioned dry. Polymethylmethacrylate cement with gentamicin was prepared in the usual fashion using a vacuum mixer. Cement was applied to the cut surface of the proximal tibia as well as along the undersurface of a size 7 rotating platform tibial component. Tibial component was positioned and impacted into place. Excess cement was removed using [employer] fast streamer. Cement was then applied to the cut surfaces of the femur as well as along the posterior flanges of the size 6 femoral component. The femoral component was positioned and impacted into place. Excess cement was removed using [employer] fast streamer. A 6 mm polyethylene trial was inserted and the knee was brought into full extension with steady axial compression applied. Finally, cement was applied to the backside of a 41 mm medialized dome patella and the patellar component was positioned and patellar clamp applied. Excess cement was removed using [employer] fast streamer. After adequate curing of the cement, the tourniquet was deflated after a total tourniquet time of 95 minutes. Hemostasis was achieved using  electrocautery. The knee was irrigated with copious amounts of normal saline with antibiotic solution using pulsatile lavage and then suctioned dry. 20 mL of 1.3% Exparel and 60 mL of 0.25% Marcaine in 40 mL of normal saline was injected along the posterior capsule, medial and lateral gutters, and along the arthrotomy site. A 6 mm stabilized rotating platform polyethylene insert was inserted and the knee was placed through a range of motion with excellent mediolateral soft tissue balancing appreciated and excellent patellar tracking noted. 2 medium drains were placed in the wound bed and brought out through separate stab incisions. The medial parapatellar portion of the incision was reapproximated using interrupted sutures of #1 Vicryl. Subcutaneous tissue was approximated in layers using first #0 Vicryl followed #2-0 Vicryl. The skin was approximated with skin staples. A sterile dressing was applied.  The patient tolerated the procedure well and was transported to the recovery room in stable condition.    Solace Wendorff P. Holley Bouche., M.D.

## 2018-05-19 NOTE — Care Management (Signed)
Sent secure inbox to CMA to check for benefit and price for Lovenox

## 2018-05-19 NOTE — Anesthesia Post-op Follow-up Note (Signed)
Anesthesia QCDR form completed.        

## 2018-05-19 NOTE — Anesthesia Preprocedure Evaluation (Signed)
Anesthesia Evaluation  Patient identified by MRN, date of birth, ID band Patient awake    Reviewed: Allergy & Precautions, H&P , NPO status , Patient's Chart, lab work & pertinent test results  History of Anesthesia Complications (+) PONV and history of anesthetic complications  Airway Mallampati: III  TM Distance: >3 FB Neck ROM: full    Dental  (+) Chipped, Poor Dentition, Loose   Pulmonary neg pulmonary ROS,           Cardiovascular hypertension, + CAD and + Peripheral Vascular Disease  negative cardio ROS       Neuro/Psych negative neurological ROS  negative psych ROS   GI/Hepatic negative GI ROS, Neg liver ROS, GERD  ,  Endo/Other  negative endocrine ROSdiabetes  Renal/GU negative Renal ROS  negative genitourinary   Musculoskeletal  (+) Arthritis , Osteoarthritis,    Abdominal   Peds  Hematology negative hematology ROS (+)   Anesthesia Other Findings Past Medical History: 04/02/2016: Benign essential hypertension No date: CAD (coronary artery disease) No date: Carotid artery disease (HCC)     Comment:  stent PCI 11/1999, antioplasty 2/02, persistent chest               pain No date: Cholelithiasis 12/15/2010: Coronary artery disease     Comment:  Overview:   Progressive angina - onset early 2001.                 08/1999 Worsening angina, stent PCI of the RCA ([place]).               11/1999 Recurrent angina, stent PCI of the LAD               ([place]).   03/2000 Persistent intermittent chest               discomfort Relook catheterization revealing in-stent               restenosis of the LAD.   04/2000 Redo angioplasty of the               LAD.   Persistent intermittent chest pain.   09/11/2000               Relook catheterizat No date: Diabetes mellitus without complication (HCC) No date: Dyslipidemia No date: GERD (gastroesophageal reflux disease) No date: H/O angioplasty No date:  Hypertension 07/27/2013: Leg pain     Comment:  Overview:   12/2012 ABIs: R> 1.39, L. 1.40 06/22/2016: Other dysphagia No date: PONV (postoperative nausea and vomiting) No date: Schatzki's ring No date: Symptomatic cholelithiasis 04/02/2016: Tubular adenoma     Comment:  Overview:  6/132  Past Surgical History: No date: APPENDECTOMY 08/29/2016: CHOLECYSTECTOMY; N/A     Comment:  Procedure: LAPAROSCOPIC CHOLECYSTECTOMY;  Surgeon:               Olean Ree, MD;  Location: [facility] ORS;  Service:               General;  Laterality: N/A; No date: colonscopy     Comment:  polypectomy 2001 + 2013: CORONARY ANGIOPLASTY WITH STENT PLACEMENT     Comment:  x 2, total of 10 No date: KNEE ARTHROSCOPY; Left  BMI    Body Mass Index:  27.20 kg/m      Reproductive/Obstetrics negative OB ROS  Anesthesia Physical  Anesthesia Plan  ASA: III  Anesthesia Plan: Spinal   Post-op Pain Management:    Induction: Intravenous  PONV Risk Score and Plan:   Airway Management Planned: Nasal Cannula  Additional Equipment:   Intra-op Plan:   Post-operative Plan:   Informed Consent: I have reviewed the patients History and Physical, chart, labs and discussed the procedure including the risks, benefits and alternatives for the proposed anesthesia with the patient or authorized representative who has indicated his/her understanding and acceptance.     Dental Advisory Given  Plan Discussed with: Anesthesiologist, CRNA and Surgeon  Anesthesia Plan Comments: (Patient reports that his left upper medial incisor is somewhat loose but not very loose.  Patient consented that the bite block used to protect his teeth for the procedure may make his tooth even more loose.  Patient voiced understanding.  Patient consented for risks of anesthesia including but not limited to:  - adverse reactions to medications - risk of intubation if required - damage to  teeth, lips or other oral mucosa - sore throat or hoarseness - Damage to heart, brain, lungs or loss of life  Patient voiced understanding.)        Anesthesia Quick Evaluation

## 2018-05-19 NOTE — Anesthesia Procedure Notes (Signed)
Spinal  Patient location during procedure: OR Start time: 05/19/2018 7:27 AM End time: 05/19/2018 7:32 AM Staffing Resident/CRNA: Bernardo Heater, CRNA Preanesthetic Checklist Completed: patient identified, site marked, surgical consent, pre-op evaluation, timeout performed, IV checked, risks and benefits discussed and monitors and equipment checked Spinal Block Patient position: sitting Prep: ChloraPrep Patient monitoring: heart rate, continuous pulse ox, blood pressure and cardiac monitor Approach: midline Location: L3-4 Injection technique: single-shot Needle Needle type: Introducer and Pencan  Needle gauge: 24 G Needle length: 9 cm Additional Notes Negative paresthesia. Negative blood return. Positive free-flowing CSF. Expiration date of kit checked and confirmed. Patient tolerated procedure well, without complications.

## 2018-05-19 NOTE — TOC Benefit Eligibility Note (Signed)
Transition of Care [facility]) Benefit Eligibility Note    Patient Details  Name: Jeremy Luna MRN: [id number] Date of Birth: Dec 22, 1955   Medication/Dose: Lovenox 40mg  once daily x 14 days  Covered?: No     Prescription Coverage Preferred Pharmacy: Any retail pharmacy   Spoke with Person/Company/Phone Number:: [employer]/[phone]     Prior Approval: Yes(If name brand required, must submit prior approval: [id number].)  Deductible: Unmet  Additional Notes: Generic Enoxaparin covered.  Estimated cost is $13.05.  Prior approval not required.      Dannette Barbara Phone Number: 05/19/2018, 1:50 PM

## 2018-05-19 NOTE — Transfer of Care (Signed)
Immediate Anesthesia Transfer of Care Note  Patient: Jeremy Luna  Procedure(s) Performed: COMPUTER ASSISTED TOTAL KNEE ARTHROPLASTY LEFT (Left Knee)  Patient Location: PACU  Anesthesia Type:Spinal  Level of Consciousness: awake and patient cooperative  Airway & Oxygen Therapy: Patient Spontanous Breathing and Patient connected to nasal cannula oxygen  Post-op Assessment: Report given to RN and Post -op Vital signs reviewed and stable  Post vital signs: Reviewed and stable  Last Vitals:  Vitals Value Taken Time  BP 125/84 05/19/2018 11:04 AM  Temp    Pulse 89 05/19/2018 11:04 AM  Resp 13 05/19/2018 11:04 AM  SpO2 98 % 05/19/2018 11:04 AM  Vitals shown include unvalidated device data.  Last Pain:  Vitals:   05/19/18 0622  TempSrc: Oral  PainSc: 1       Patients Stated Pain Goal: 1 (54/09/81 1914)  Complications: No apparent anesthesia complications

## 2018-05-20 LAB — GLUCOSE, CAPILLARY
Glucose-Capillary: 124 mg/dL — ABNORMAL HIGH (ref 70–99)
Glucose-Capillary: 146 mg/dL — ABNORMAL HIGH (ref 70–99)
Glucose-Capillary: 147 mg/dL — ABNORMAL HIGH (ref 70–99)
Glucose-Capillary: 141 mg/dL — ABNORMAL HIGH (ref 70–99)

## 2018-05-20 MED ORDER — ENOXAPARIN SODIUM 40 MG/0.4ML ~~LOC~~ SOLN: 40.0000 mg | SUBCUTANEOUS | 0 refills | Status: DC

## 2018-05-20 MED ORDER — TRAMADOL HCL 50 MG PO TABS: 50.0000 mg | ORAL_TABLET | Freq: Four times a day (QID) | ORAL | 0 refills | Status: DC | PRN

## 2018-05-20 MED ORDER — OXYCODONE HCL 5 MG PO TABS: 5.0000 mg | ORAL_TABLET | ORAL | 0 refills | Status: DC | PRN

## 2018-05-20 NOTE — Progress Notes (Signed)
Physical Therapy Treatment Patient Details Name: Jeremy Luna MRN: [id number] DOB: 01-17-1956 Today's Date: 05/20/2018    History of Present Illness Patient is 63 yo male POD #1 L TKA, WBAT. PMH of HTN, CAD, PVD, cardiac stents, angioplasty, GERD, DM.    PT Comments    Pt agreeable to PT at this time, no complaints of pain at rest, 4/10 with ambulation in L knee. Patient performed therapeutic exercises with minimal verbal cues. Mobilized to EOB mod I, and performed several sit <> stands during session with supervision-CGA and RW. Patient able to ambulate >26ft this session with RW, cues for heel strike, TKE and increased weight bearing on LLE. Patient also performed stair navigation with mod verbal cues and family member to assist to stabilize RW with PT instruction. Overall steady and safe stair navigation. The patient would benefit from further skilled PT to continue to progress towards goals and return to PLOF.     Follow Up Recommendations  Home health PT     Equipment Recommendations  3in1 (PT);Other (comment)    Recommendations for Other Services       Precautions / Restrictions Precautions Precautions: Fall;Knee Precaution Booklet Issued: Yes (comment) Restrictions Weight Bearing Restrictions: Yes LLE Weight Bearing: Weight bearing as tolerated    Mobility  Bed Mobility Overal bed mobility: Modified Independent                Transfers Overall transfer level: Needs assistance Equipment used: Rolling walker (2 wheeled) Transfers: Sit to/from Stand Sit to Stand: Supervision            Ambulation/Gait Ambulation/Gait assistance: Supervision;Min guard Gait Distance (Feet): 220 Feet Assistive device: Rolling walker (2 wheeled)       General Gait Details: Occasional cues for AD management   Stairs Stairs: Yes Stairs assistance: Min assist Stair Management: No rails;With walker;Backwards;Forwards Number of Stairs: 2 General stair comments: family  member available to assist and practice stabilizing walker   Wheelchair Mobility    Modified Rankin (Stroke Patients Only)       Balance Overall balance assessment: Needs assistance Sitting-balance support: Feet supported         Standing balance-Leahy Scale: Fair                              Cognition Arousal/Alertness: Awake/alert Behavior During Therapy: WFL for tasks assessed/performed Overall Cognitive Status: Within Functional Limits for tasks assessed                                        Exercises Total Joint Exercises Quad Sets: AROM;10 reps;Left Straight Leg Raises: AROM;Left;10 reps;Strengthening Long Arc Quad: AROM;Strengthening;Left;10 reps Knee Flexion: AROM;Left;10 reps Goniometric ROM: L knee extension: 0deg, L knee flexion: 72 deg    General Comments        Pertinent Vitals/Pain Pain Assessment: 0-10 Pain Score: 4  Pain Location: with ambulation Pain Intervention(s): Monitored during session;Repositioned;Ice applied    Home Living                      Prior Function            PT Goals (current goals can now be found in the care plan section) Progress towards PT goals: Progressing toward goals    Frequency    BID      PT Plan  Current plan remains appropriate    Co-evaluation              AM-PAC PT "6 Clicks" Mobility   Outcome Measure  Help needed turning from your back to your side while in a flat bed without using bedrails?: A Little Help needed moving from lying on your back to sitting on the side of a flat bed without using bedrails?: A Little Help needed moving to and from a bed to a chair (including a wheelchair)?: A Little Help needed standing up from a chair using your arms (e.g., wheelchair or bedside chair)?: A Little Help needed to walk in hospital room?: A Little Help needed climbing 3-5 steps with a railing? : A Little 6 Click Score: 18    End of Session Equipment  Utilized During Treatment: Gait belt Activity Tolerance: Patient tolerated treatment well Patient left: with chair alarm set;in chair;with family/visitor present;with call bell/phone within reach;with SCD's reapplied Nurse Communication: Mobility status PT Visit Diagnosis: Difficulty in walking, not elsewhere classified (R26.2);Muscle weakness (generalized) (M62.81);Pain;Other abnormalities of gait and mobility (R26.89) Pain - Right/Left: Left Pain - part of body: Knee     Time: 1027-2536 PT Time Calculation (min) (ACUTE ONLY): 38 min  Charges:  $Gait Training: 8-22 mins $Therapeutic Exercise: 8-22 mins $Therapeutic Activity: 8-22 mins                     Lieutenant Diego PT, DPT 4:47 PM,05/20/18 [phone]

## 2018-05-20 NOTE — Discharge Summary (Signed)
Physician Discharge Summary  Patient ID: Jeremy Luna MRN: [id number] DOB/AGE: 06-12-1955 63 y.o.  Admit date: 05/19/2018 Discharge date: 05/21/2018  Admission Diagnoses:  PRIMARY OSTEOARTHRITIS OF LEFT KNEE   Discharge Diagnoses: Patient Active Problem List   Diagnosis Date Noted  . Total knee replacement status 05/19/2018  . DM type 2 with diabetic mixed hyperlipidemia ([place]) 10/28/2017  . Primary osteoarthritis of left knee 04/30/2017  . Hyperlipidemia, mixed 04/08/2017  . Schatzki's ring 12/10/2016  . Symptomatic cholelithiasis   . Other dysphagia 06/22/2016  . Benign essential hypertension 04/02/2016  . Tubular adenoma 04/02/2016  . Diabetes mellitus type 2, controlled, without complications ([place]) 37/12/6267  . Leg pain 07/27/2013  . GERD (gastroesophageal reflux disease) 08/21/2011  . Coronary artery disease 12/15/2010  . Dyslipidemia 12/15/2010    Past Medical History:  Diagnosis Date  . Benign essential hypertension 04/02/2016  . CAD (coronary artery disease)   . Carotid artery disease ([place])    stent PCI 11/1999, antioplasty 2/02, persistent chest pain  . Cholelithiasis   . Coronary artery disease 12/15/2010   Overview:   Progressive angina - onset early 2001.   08/1999 Worsening angina, stent PCI of the RCA ([place]).   11/1999 Recurrent angina, stent PCI of the LAD ([place]).   03/2000 Persistent intermittent chest discomfort Relook catheterization revealing in-stent restenosis of the LAD.   04/2000 Redo angioplasty of the LAD.   Persistent intermittent chest pain.   09/11/2000 Relook catheterizat  . Diabetes mellitus without complication ([place])   . Dyslipidemia   . GERD (gastroesophageal reflux disease)   . H/O angioplasty   . Hypertension   . Leg pain 07/27/2013   Overview:   12/2012 ABIs: R> 1.39, L. 1.40  . Other dysphagia 06/22/2016  . PONV (postoperative nausea and vomiting)    pt also says he is hard to wake up   . Schatzki's ring   . Symptomatic  cholelithiasis   . Tubular adenoma 04/02/2016   Overview:  6/132     Transfusion: No transfusion during this admission   Consultants (if any):   Discharged Condition: Improved  Hospital Course: Jeremy Luna is an 63 y.o. male who was admitted 05/19/2018 with a diagnosis of degenerative arthrosis left knee and went to the operating room on 05/19/2018 and underwent the above named procedures.    Surgeries:Procedure(s): COMPUTER ASSISTED TOTAL KNEE ARTHROPLASTY LEFT on 05/19/2018   PRE-OPERATIVE DIAGNOSIS: Degenerative arthrosis of the left knee, primary  POST-OPERATIVE DIAGNOSIS:  Same  PROCEDURE:  Left total knee arthroplasty using computer-assisted navigation  SURGEON:  Marciano Luna. M.D.  ASSISTANT: Jeremy Noon, PA-C (present and scrubbed throughout the case, critical for assistance with exposure, retraction, instrumentation, and closure)  ANESTHESIA: spinal  ESTIMATED BLOOD LOSS: 50 mL  FLUIDS REPLACED: 1200 mL of crystalloid  TOURNIQUET TIME: 95 minutes  DRAINS: 2 medium Hemovac drains  SOFT TISSUE RELEASES: Anterior cruciate ligament, posterior cruciate ligament, deep medial collateral ligament, patellofemoral ligament  IMPLANTS UTILIZED: DePuy Attune size 6 posterior stabilized femoral component (cemented), size 7 rotating platform tibial component (cemented), 41 mm medialized dome patella (cemented), and a 6 mm stabilized rotating platform polyethylene insert.  INDICATIONS FOR SURGERY: Jeremy Luna is a 63 y.o. year old male with a long history of progressive knee pain. X-rays demonstrated severe degenerative changes in tricompartmental fashion. The patient had not seen any significant improvement despite conservative nonsurgical intervention. After discussion of the risks and benefits of surgical intervention, the patient expressed understanding of the risks benefits  and agree with plans for total knee arthroplasty.   The risks, benefits,  and alternatives were discussed at length including but not limited to the risks of infection, bleeding, nerve injury, stiffness, blood clots, the need for revision surgery, cardiopulmonary complications, among others, and they were willing to proceed. Patient tolerated the surgery well. No complications .Patient was taken to PACU where she was stabilized and then transferred to the orthopedic floor.  Patient started on Lovenox 30 mg q 12 hrs. Foot pumps applied bilaterally at 80 mm hgb. Heels elevated off bed with rolled towels. No evidence of DVT. Calves non tender. Negative Homan. Physical therapy started on day #1 for gait training and transfer with OT starting on  day #1 for ADL and assisted devices. Patient has done well with therapy. Ambulated greater than 200 feet upon being discharged.  Was able to ascend and descend 4 steps safely and independently  Patient's IV And Foley were discontinued on day #1 with Hemovac being discontinued on day #2. Dressing was changed on day 2 prior to patient being discharged   He was given perioperative antibiotics:  Anti-infectives (From admission, onward)   Start     Dose/Rate Route Frequency Ordered Stop   05/19/18 1345  ceFAZolin (ANCEF) IVPB 2g/100 mL premix     2 g 200 mL/hr over 30 Minutes Intravenous Every 6 hours 05/19/18 1259 05/20/18 1344   05/19/18 0607  ceFAZolin (ANCEF) 2-4 GM/100ML-% IVPB    Note to Pharmacy:  Jeremy Luna  : cabinet override      05/19/18 0607 05/19/18 1829   05/19/18 0600  ceFAZolin (ANCEF) IVPB 2g/100 mL premix  Status:  Discontinued     2 g 200 mL/hr over 30 Minutes Intravenous On call to O.R. 05/18/18 2314 05/19/18 0615    .  He was fitted with AV 1 compression foot pump devices, instructed on heel pumps, early ambulation, and fitted with TED stockings bilaterally for DVT prophylaxis.  He benefited maximally from the hospital stay and there were no complications.    Recent vital signs:  Vitals:   05/20/18  0013 05/20/18 0407  BP: 125/75 122/73  Pulse: 77 75  Resp: 20 18  Temp: (!) 97.5 F (36.4 C) 97.6 F (36.4 C)  SpO2: 97% 99%    Recent laboratory studies:  Lab Results  Component Value Date   HGB 13.9 05/07/2018   HGB 13.7 07/25/2016   HGB 14.0 07/23/2016   Lab Results  Component Value Date   WBC 4.2 05/07/2018   PLT 191 05/07/2018   Lab Results  Component Value Date   INR 1.0 05/07/2018   Lab Results  Component Value Date   NA 142 05/07/2018   K 4.0 05/07/2018   CL 109 05/07/2018   CO2 24 05/07/2018   BUN 13 05/07/2018   CREATININE 0.65 05/07/2018   GLUCOSE 135 (H) 05/07/2018    Discharge Medications:   Allergies as of 05/20/2018   No Known Allergies     Medication List    STOP taking these medications   aspirin 81 MG tablet   ibuprofen 200 MG tablet Commonly known as:  ADVIL,MOTRIN     TAKE these medications   atorvastatin 40 MG tablet Commonly known as:  LIPITOR Take 40 mg by mouth daily.   enoxaparin 40 MG/0.4ML injection Commonly known as:  LOVENOX Inject 0.4 mLs (40 mg total) into the skin daily for 14 days. Start taking on:  May 22, 2018   lisinopril 20 MG tablet  Commonly known as:  PRINIVIL,ZESTRIL Take 20 mg by mouth daily.   metFORMIN 500 MG tablet Commonly known as:  GLUCOPHAGE Take 500-1,000 mg by mouth 2 (two) times daily with a meal. Take 1000 mg by mouth in the morning and take 500 mg by mouth in the evening.   nitroGLYCERIN 0.4 MG SL tablet Commonly known as:  NITROSTAT Place 0.4 mg under the tongue every 5 (five) minutes as needed for chest pain.   omeprazole 20 MG capsule Commonly known as:  PRILOSEC Take 20 mg by mouth daily.   oxyCODONE 5 MG immediate release tablet Commonly known as:  Oxy IR/ROXICODONE Take 1 tablet (5 mg total) by mouth every 4 (four) hours as needed for moderate pain (pain score 4-6).   traMADol 50 MG tablet Commonly known as:  ULTRAM Take 1-2 tablets (50-100 mg total) by mouth every 6 (six)  hours as needed for moderate pain.            Durable Medical Equipment  (From admission, onward)         Start     Ordered   05/19/18 1300  DME Walker rolling  Once    Question:  Patient needs a walker to treat with the following condition  Answer:  Total knee replacement status   05/19/18 1259   05/19/18 1300  DME Bedside commode  Once    Question:  Patient needs a bedside commode to treat with the following condition  Answer:  Total knee replacement status   05/19/18 1259          Diagnostic Studies: Dg Knee Left Port  Result Date: 05/19/2018 CLINICAL DATA:  Followup knee replacement EXAM: PORTABLE LEFT KNEE - 1-2 VIEW COMPARISON:  None. FINDINGS: Two-view show total knee arthroplasty. Components appear well positioned. Suprapatellar drain in place. No radiographic complication. IMPRESSION: Good appearance following total knee arthroplasty. Suprapatellar drain. Electronically Signed   By: Nelson Chimes M.D.   On: 05/19/2018 11:22    Disposition:   Discharge Instructions    Increase activity slowly   Complete by:  As directed       Follow-up Information    Watt Climes, PA On 06/02/2018.   Specialty:  Physician Assistant Why:  at 1:15pm Contact information: [place] [address] [phone]        Dereck Leep, MD On 07/01/2018.   Specialty:  Orthopedic Surgery Why:  at 9:45am Contact information: [place] [address] [phone]            Signed: Watt Climes 05/20/2018, 7:28 AM

## 2018-05-20 NOTE — Progress Notes (Signed)
ORTHOPAEDICS PROGRESS NOTE  PATIENT NAME: Jeremy Luna DOB: [birth year]  MRN: [id number]  POD # 1: Left total knee arthroplasty  Subjective: The patient states the pain is been well controlled.  No nausea or vomiting. Physical therapy was well-tolerated yesterday, although the patient was not allowed to walk due to some residual numbness from the spinal anesthesia.  Objective: Vital signs in last 24 hours: Temp:  [97.4 F (36.3 C)-97.9 F (36.6 C)] 97.6 F (36.4 C) (03/17 0407) Pulse Rate:  [60-89] 75 (03/17 0407) Resp:  [11-20] 18 (03/17 0407) BP: (118-135)/(73-87) 122/73 (03/17 0407) SpO2:  [96 %-99 %] 99 % (03/17 0407)  Intake/Output from previous day: 03/16 0701 - 03/17 0700 In: 3396.6 [P.O.:240; I.V.:2548.9; IV Piggyback:607.7] Out: 2735 [Urine:2325; Drains:360; Blood:50]  No results for input(s): WBC, HGB, HCT, PLT, K, CL, CO2, BUN, CREATININE, GLUCOSE, CALCIUM, LABPT, INR in the last 72 hours.  EXAM General: Well-developed well-nourished male seen in no apparent discomfort. Lungs: clear to auscultation Cardiac: normal rate and regular rhythm Abdomen: Soft, nontender, nondistended.  Bowel sounds are present. Left lower extremity: The dressing is dry and intact.  Hemovac drain and Polar Care are in place and functioning.  The patient is able to perform an independent straight leg raise.  Homans test is negative. Neurologic: Awake, alert, and oriented.  Sensory and motor function are intact.  Assessment: Left total knee arthroplasty  Secondary diagnoses: Hypertension Gastroesophageal reflux disease Dyslipidemia Diabetes Coronary artery disease Carotid artery disease  Plan: Today's goals were reviewed with the patient.  Continue with physical therapy and Occupational Therapy as per total knee arthroplasty rehab protocol. Plan is to go Home after hospital stay. Home with Physical Therapy with [facility]. DVT Prophylaxis - Lovenox, Foot Pumps and TED hose  [name] P.  Holley Bouche M.D.

## 2018-05-20 NOTE — Progress Notes (Signed)
PT Cancellation Note  Patient Details Name: Jeremy Luna MRN: [id number] DOB: Oct 13, 1955   Cancelled Treatment:    Reason Eval/Treat Not Completed: Other (comment)(Pt requesting PT to come back later, reported he just got into bed after using the commode, wants to rest a bit prior to PT. PT will follow up as able.)  Lieutenant Diego PT, DPT 2:14 PM,05/20/18 [phone]

## 2018-05-20 NOTE — Anesthesia Postprocedure Evaluation (Signed)
Anesthesia Post Note  Patient: MELBERT BOTELHO  Procedure(s) Performed: COMPUTER ASSISTED TOTAL KNEE ARTHROPLASTY LEFT (Left Knee)  Patient location during evaluation: Nursing Unit Anesthesia Type: Spinal Level of consciousness: oriented and awake and alert Pain management: pain level controlled Vital Signs Assessment: post-procedure vital signs reviewed and stable Respiratory status: spontaneous breathing and respiratory function stable Cardiovascular status: blood pressure returned to baseline and stable Postop Assessment: no headache, no backache, no apparent nausea or vomiting and patient able to bend at knees Anesthetic complications: no     Last Vitals:  Vitals:   05/20/18 0407 05/20/18 0756  BP: 122/73 124/76  Pulse: 75 (!) 53  Resp: 18 18  Temp: 36.4 C (!) 36.4 C  SpO2: 99% 99%    Last Pain:  Vitals:   05/20/18 0756  TempSrc: Oral  PainSc:                  Hedda Slade

## 2018-05-20 NOTE — Progress Notes (Signed)
Physical Therapy Treatment Patient Details Name: Jeremy Luna MRN: [id number] DOB: 1955-03-17 Today's Date: 05/20/2018    History of Present Illness Patient is 63 yo male POD #1 L TKA, WBAT. PMH of HTN, CAD, PVD, cardiac stents, angioplasty, GERD, DM.    PT Comments    Patient agreeable to PT, no complaints of pain at rest. Patient was able to perform SLR without log, deferred use of KI for ambulation at this time. Patient demonstrated bed mobility mod I. Sit <> stand with CGA, RW, and verbal cues for hand placement. Pt ambulated with RW ~167ft with CGA, intermittent verbal cues for activity pacing, RW management, and gait pattern. Patient easily progressed from step to, to step through, and decreased weight bearing through UEs. Patient did have episode of emesis upon returning to chair, RN notified and in room at end of session.      Follow Up Recommendations  Home health PT;Supervision for mobility/OOB     Equipment Recommendations  3in1 (PT);Other (comment)    Recommendations for Other Services       Precautions / Restrictions Precautions Precautions: Fall;Knee Precaution Booklet Issued: Yes (comment) Restrictions Weight Bearing Restrictions: Yes LLE Weight Bearing: Weight bearing as tolerated    Mobility  Bed Mobility Overal bed mobility: Modified Independent                Transfers Overall transfer level: Needs assistance Equipment used: Rolling walker (2 wheeled) Transfers: Sit to/from Stand Sit to Stand: Min guard         General transfer comment: Verbal cues for hand placement and sequencing of movements  Ambulation/Gait   Gait Distance (Feet): 180 Feet Assistive device: Rolling walker (2 wheeled)       General Gait Details: Pt able to progress naturally from step to to step through, intermittent  verbal cues for RW management   Stairs             Wheelchair Mobility    Modified Rankin (Stroke Patients Only)       Balance  Overall balance assessment: Needs assistance Sitting-balance support: Feet supported Sitting balance-Leahy Scale: Good                                      Cognition Arousal/Alertness: Awake/alert Behavior During Therapy: WFL for tasks assessed/performed Overall Cognitive Status: Within Functional Limits for tasks assessed                                        Exercises Total Joint Exercises Heel Slides: AROM;Left;10 reps Straight Leg Raises: AROM;Left;10 reps;Strengthening    General Comments        Pertinent Vitals/Pain Pain Assessment: 0-10 Pain Score: 3  Pain Location: with ambulation Pain Intervention(s): Limited activity within patient's tolerance;Monitored during session;Repositioned;Ice applied    Home Living                      Prior Function            PT Goals (current goals can now be found in the care plan section) Progress towards PT goals: Progressing toward goals    Frequency    BID      PT Plan Current plan remains appropriate    Co-evaluation  AM-PAC PT "6 Clicks" Mobility   Outcome Measure  Help needed turning from your back to your side while in a flat bed without using bedrails?: A Little Help needed moving from lying on your back to sitting on the side of a flat bed without using bedrails?: A Little Help needed moving to and from a bed to a chair (including a wheelchair)?: A Little Help needed standing up from a chair using your arms (e.g., wheelchair or bedside chair)?: A Little Help needed to walk in hospital room?: A Little Help needed climbing 3-5 steps with a railing? : A Little 6 Click Score: 18    End of Session Equipment Utilized During Treatment: Gait belt Activity Tolerance: Patient tolerated treatment well Patient left: with chair alarm set;in chair;with family/visitor present;with call bell/phone within reach;with SCD's reapplied(heels elevated polar care in  place) Nurse Communication: Mobility status PT Visit Diagnosis: Difficulty in walking, not elsewhere classified (R26.2);Muscle weakness (generalized) (M62.81);Pain;Other abnormalities of gait and mobility (R26.89) Pain - Right/Left: Left Pain - part of body: Knee     Time: 6438-3779 PT Time Calculation (min) (ACUTE ONLY): 31 min  Charges:  $Therapeutic Exercise: 8-22 mins $Therapeutic Activity: 8-22 mins                     Lieutenant Diego PT, DPT 9:25 AM,05/20/18 [phone]

## 2018-05-20 NOTE — Evaluation (Signed)
Occupational Therapy Evaluation Patient Details Name: Jeremy Luna MRN: [id number] DOB: [birth year] Today's Date: 05/20/2018    History of Present Illness Patient is 63 yo male POD #1 L TKA, WBAT. PMH of HTN, CAD, PVD, cardiac stents, angioplasty, GERD, DM.   Clinical Impression   Pt seen for OT evaluation this date, POD#1 from above surgery. Pt was independent in all ADLs prior to surgery, and working in Dealer work. Pt is eager to return to PLOF with less pain and improved safety and independence. Pt currently requires minimal assist for LB dressing while in seated position due to pain and limited AROM of L knee. Per pt, spouse able to provide needed level of assist at home. Pt instructed in polar care mgt, falls prevention strategies, home/routines modifications, DME/AE for LB bathing and dressing tasks, and compression stocking mgt. Pt verbalized understanding and politely denied additional OT needs. OT in agreement. Additionally, do not currently anticipate any OT needs following this hospitalization. Will sign off. Please re-consult if additional needs arise.     Follow Up Recommendations  No OT follow up    Equipment Recommendations  3 in 1 bedside commode    Recommendations for Other Services       Precautions / Restrictions Precautions Precautions: Fall;Knee Precaution Booklet Issued: Yes (comment) Restrictions Weight Bearing Restrictions: Yes LLE Weight Bearing: Weight bearing as tolerated      Mobility Bed Mobility             General bed mobility comments: deferred, up in recliner  Transfers         General transfer comment: deferred per pt request 2/2 nausea    Balance Overall balance assessment: Needs assistance Sitting-balance support: Feet supported Sitting balance-Leahy Scale: Good                                     ADL either performed or assessed with clinical judgement   ADL Overall ADL's : Needs assistance/impaired                                        General ADL Comments: Min A for LB ADL 2/2 decreased strength/ROM in LLE s/p TKA     Vision Baseline Vision/History: Wears glasses Wears Glasses: Reading only Patient Visual Report: No change from baseline       Perception     Praxis      Pertinent Vitals/Pain Pain Assessment: No/denies pain Pain Score: 3  Pain Location: with ambulation Pain Intervention(s): Monitored during session     Hand Dominance Right   Extremity/Trunk Assessment Upper Extremity Assessment Upper Extremity Assessment: Overall WFL for tasks assessed   Lower Extremity Assessment Lower Extremity Assessment: LLE deficits/detail LLE Deficits / Details: expected post-op strength/ROM deficits in LLE   Cervical / Trunk Assessment Cervical / Trunk Assessment: Normal   Communication Communication Communication: No difficulties   Cognition Arousal/Alertness: Awake/alert Behavior During Therapy: WFL for tasks assessed/performed Overall Cognitive Status: Within Functional Limits for tasks assessed                                     General Comments       Exercises  Other Exercises Other Exercises: pt instructed in compression stocking mgt, polar care  mgt, RW use in kitchen/bathroom to improve indep with IADL and grooming, falls prevention, and AE/DME for ADL tasks   Shoulder Instructions      Home Living Family/patient expects to be discharged to:: Private residence Living Arrangements: Spouse/significant other Available Help at Discharge: Family;Available 24 hours/day Type of Home: House Home Access: Stairs to enter [employer] of Steps: 3 Entrance Stairs-Rails: None Home Layout: One level     Bathroom Shower/Tub: Occupational psychologist: Standard     Home Equipment: Environmental consultant - 2 wheels          Prior Functioning/Environment Level of Independence: Independent        Comments: Indep in all  aspects, no falls, works as an Engineer, civil (consulting) Problem List: Decreased strength;Decreased range of motion;Decreased knowledge of use of DME or AE      OT Treatment/Interventions:      OT Goals(Current goals can be found in the care plan section) Acute Rehab OT Goals Patient Stated Goal: to return to PLOF OT Goal Formulation: All assessment and education complete, DC therapy  OT Frequency:     Barriers to D/C:            Co-evaluation              AM-PAC OT "6 Clicks" Daily Activity     Outcome Measure Help from another person eating meals?: None Help from another person taking care of personal grooming?: None Help from another person toileting, which includes using toliet, bedpan, or urinal?: A Little Help from another person bathing (including washing, rinsing, drying)?: A Little Help from another person to put on and taking off regular upper body clothing?: None Help from another person to put on and taking off regular lower body clothing?: A Little 6 Click Score: 21   End of Session    Activity Tolerance: Patient tolerated treatment well Patient left: in chair;with call bell/phone within reach;with chair alarm set;with SCD's reapplied;Other (comment)(polar care in place)  OT Visit Diagnosis: Other abnormalities of gait and mobility (R26.89)                Time: 5784-6962 OT Time Calculation (min): 13 min Charges:  OT General Charges $OT Visit: 1 Visit OT Evaluation $OT Eval Low Complexity: 1 Low OT Treatments $Self Care/Home Management : 8-22 mins  Jeni Salles, MPH, MS, OTR/L ascom [phone] 05/20/18, 10:26 AM

## 2018-05-21 LAB — GLUCOSE, CAPILLARY
Glucose-Capillary: 149 mg/dL — ABNORMAL HIGH (ref 70–99)
Glucose-Capillary: 153 mg/dL — ABNORMAL HIGH (ref 70–99)

## 2018-05-21 NOTE — TOC Progression Note (Signed)
Transition of Care [facility]) - Progression Note    Patient Details  Name: Jeremy Luna MRN: [id number] Date of Birth: [birth year]  Transition of Care [facility]) CM/SW [place], RN Phone Number: 05/21/2018, 11:33 AM  Clinical Narrative:    Patient has Equipment and is set up with Home health services, has been provided the cost of Lovenox and has no other needs   Expected Discharge Plan: [place] Barriers to Discharge: Barriers Resolved  Expected Discharge Plan and Services Expected Discharge Plan: [place] Discharge Planning Services: CM Consult Post Acute Care Choice: [place] arrangements for the past 2 months: Single Family Home Expected Discharge Date: 05/21/18               DME Arranged: 3-N-1 DME Agency: [facility] HH Arranged: PT [place] Agency: [facility] (now [facility])   Social Determinants of Health (SDOH) Interventions    Readmission Risk Interventions 30 Day Unplanned Readmission Risk Score     Admission (Current) from 05/19/2018 in [place] (1A)  30 Day Unplanned Readmission Risk Score (%)  6 Filed at 05/21/2018 0801     This score is the patient's risk of an unplanned readmission within 30 days of being discharged (0 -100%). The score is based on dignosis, age, lab data, medications, orders, and past utilization.   Low:  0-14.9   Medium: 15-21.9   High: 22-29.9   Extreme: 30 and above       No flowsheet data found.

## 2018-05-21 NOTE — Progress Notes (Signed)
Physical Therapy Treatment Patient Details Name: Jeremy Luna MRN: [id number] DOB: 07-26-1955 Today's Date: 05/21/2018    History of Present Illness Patient is 63 yo male POD #1 L TKA, WBAT. PMH of HTN, CAD, PVD, cardiac stents, angioplasty, GERD, DM.    PT Comments    Participated in exercises as described below.  Ambulated around unit with ease.  Declined further stair training.  Comfortable and ready for d/c.  Reviewed HEP.   Follow Up Recommendations  Home health PT     Equipment Recommendations  3in1 (PT);Other (comment)    Recommendations for Other Services       Precautions / Restrictions Precautions Precautions: Fall;Knee Precaution Booklet Issued: Yes (comment) Restrictions Weight Bearing Restrictions: Yes LLE Weight Bearing: Weight bearing as tolerated    Mobility  Bed Mobility Overal bed mobility: Modified Independent Bed Mobility: Supine to Sit;Sit to Supine     Supine to sit: Supervision Sit to supine: Supervision      Transfers Overall transfer level: Needs assistance Equipment used: Rolling walker (2 wheeled) Transfers: Sit to/from Stand Sit to Stand: Supervision            Ambulation/Gait Ambulation/Gait assistance: Supervision;Min guard Gait Distance (Feet): 220 Feet Assistive device: Rolling walker (2 wheeled)           Stairs   Stairs assistance: Min assist     General stair comments: declined today.  dempnstrated competence yesterady   Wheelchair Mobility    Modified Rankin (Stroke Patients Only)       Balance Overall balance assessment: Needs assistance Sitting-balance support: Feet supported Sitting balance-Leahy Scale: Good     Standing balance support: Bilateral upper extremity supported Standing balance-Leahy Scale: Good                              Cognition Arousal/Alertness: Awake/alert Behavior During Therapy: WFL for tasks assessed/performed Overall Cognitive Status: Within Functional  Limits for tasks assessed                                        Exercises Total Joint Exercises Ankle Circles/Pumps: AROM;10 reps;Both Quad Sets: AROM;10 reps;Left Gluteal Sets: AROM;Both;10 reps Short Arc Quad: AROM;Left;10 reps Heel Slides: AROM;Left;10 reps Hip ABduction/ADduction: AAROM;Left;10 reps Straight Leg Raises: AROM;Left;10 reps;Strengthening Long Arc Quad: AROM;Strengthening;Left;10 reps Knee Flexion: AROM;Left;10 reps Goniometric ROM: 1-88    General Comments        Pertinent Vitals/Pain Pain Assessment: Faces Faces Pain Scale: Hurts little more Pain Location: with ambulation Pain Descriptors / Indicators: Operative site guarding;Sore Pain Intervention(s): Limited activity within patient's tolerance;Monitored during session;Repositioned;Ice applied    Home Living                      Prior Function            PT Goals (current goals can now be found in the care plan section) Progress towards PT goals: Progressing toward goals    Frequency    BID      PT Plan Current plan remains appropriate    Co-evaluation              AM-PAC PT "6 Clicks" Mobility   Outcome Measure  Help needed turning from your back to your side while in a flat bed without using bedrails?: None Help needed moving from lying on your  back to sitting on the side of a flat bed without using bedrails?: None Help needed moving to and from a bed to a chair (including a wheelchair)?: None Help needed standing up from a chair using your arms (e.g., wheelchair or bedside chair)?: A Little Help needed to walk in hospital room?: A Little Help needed climbing 3-5 steps with a railing? : A Little 6 Click Score: 21    End of Session Equipment Utilized During Treatment: Gait belt Activity Tolerance: Patient tolerated treatment well Patient left: in bed;with call bell/phone within reach;with family/visitor present   Pain - Right/Left: Left Pain - part  of body: Knee     Time: 1012-1022 PT Time Calculation (min) (ACUTE ONLY): 10 min  Charges:  $Gait Training: 8-22 mins                     Chesley Noon, PTA 05/21/18, 10:57 AM

## 2018-05-21 NOTE — Progress Notes (Signed)
Subjective: 2 Days Post-Op Procedure(s) (LRB): COMPUTER ASSISTED TOTAL KNEE ARTHROPLASTY LEFT (Left) Patient reports pain as mild.   Patient is well, and has had no acute complaints or problems Patient did very well with physical therapy yesterday.  Was able to ambulate over 200 feet.  Was able to also go up and down 4 steps safely and independently. Voicing no complaints.  Sleeping well Plan is to go Home after hospital stay. no nausea and no vomiting Patient denies any chest pains or shortness of breath. Objective: Vital signs in last 24 hours: Temp:  [97.4 F (36.3 C)-98.2 F (36.8 C)] 98.2 F (36.8 C) (03/18 0002) Pulse Rate:  [53-71] 71 (03/18 0002) Resp:  [17-18] 17 (03/18 0002) BP: (118-136)/(72-76) 136/75 (03/18 0002) SpO2:  [96 %-99 %] 97 % (03/18 0002) well approximated incision Heels are non tender and elevated off the bed using rolled towels Intake/Output from previous day: 03/17 0701 - 03/18 0700 In: 1266.3 [P.O.:480; I.V.:594.1; IV Piggyback:192.2] Out: 380 [Urine:200; Drains:180] Intake/Output this shift: No intake/output data recorded.  No results for input(s): HGB in the last 72 hours. No results for input(s): WBC, RBC, HCT, PLT in the last 72 hours. No results for input(s): NA, K, CL, CO2, BUN, CREATININE, GLUCOSE, CALCIUM in the last 72 hours. No results for input(s): LABPT, INR in the last 72 hours.  EXAM General - Patient is Alert, Appropriate and Oriented Extremity - Neurologically intact Neurovascular intact Sensation intact distally Intact pulses distally Dorsiflexion/Plantar flexion intact No cellulitis present Compartment soft Dressing - dressing C/D/I Motor Function - intact, moving foot and toes well on exam.    Past Medical History:  Diagnosis Date  . Benign essential hypertension 04/02/2016  . CAD (coronary artery disease)   . Carotid artery disease ([place])    stent PCI 11/1999, antioplasty 2/02, persistent chest pain  . Cholelithiasis    . Coronary artery disease 12/15/2010   Overview:   Progressive angina - onset early 2001.   08/1999 Worsening angina, stent PCI of the RCA ([place]).   11/1999 Recurrent angina, stent PCI of the LAD ([place]).   03/2000 Persistent intermittent chest discomfort Relook catheterization revealing in-stent restenosis of the LAD.   04/2000 Redo angioplasty of the LAD.   Persistent intermittent chest pain.   09/11/2000 Relook catheterizat  . Diabetes mellitus without complication ([place])   . Dyslipidemia   . GERD (gastroesophageal reflux disease)   . H/O angioplasty   . Hypertension   . Leg pain 07/27/2013   Overview:   12/2012 ABIs: R> 1.39, L. 1.40  . Other dysphagia 06/22/2016  . PONV (postoperative nausea and vomiting)    pt also says he is hard to wake up   . Schatzki's ring   . Symptomatic cholelithiasis   . Tubular adenoma 04/02/2016   Overview:  6/132    Assessment/Plan: 2 Days Post-Op Procedure(s) (LRB): COMPUTER ASSISTED TOTAL KNEE ARTHROPLASTY LEFT (Left) Active Problems:   Total knee replacement status  Estimated body mass index is 26.35 kg/m as calculated from the following:   Height as of this encounter: 5\' 11"  (1.803 m).   Weight as of this encounter: 85.7 kg. Up with therapy Discharge home with home health  Labs: None DVT Prophylaxis - Lovenox, Foot Pumps and TED hose Weight-Bearing as tolerated to left leg Hemovac was discontinued today.  Ends of the drain appeared to be intact. Please wash operative leg, change dressing and apply TED stockings to both legs prior to being discharged Be sure bone foam  goes home with patient Please give the patient 2 extra honeycomb dressings to take home  [place]. [place] 05/21/2018, 7:25 AM

## 2018-05-21 NOTE — Progress Notes (Signed)
Patient is alert and oriented and able to verbalize needs. No complaints of pain. VSS. PIV removed. Dressing changed. Printed AVS and 2 extra dressings provided to patient in discharge packet. All follow up instructions gone over and no concerns voiced. All belongings packed and nurse escorted pt to car via wc.  Bethann Punches, RN

## 2020-06-12 ENCOUNTER — Other Ambulatory Visit: Payer: Self-pay

## 2020-06-12 ENCOUNTER — Ambulatory Visit
Admission: EM | Admit: 2020-06-12 | Discharge: 2020-06-12 | Disposition: A | Discharge status: Home / Self Care | Payer: BC Managed Care – PPO | Attending: Family Medicine | Admitting: Family Medicine

## 2020-06-12 ENCOUNTER — Ambulatory Visit (INDEPENDENT_AMBULATORY_CARE_PROVIDER_SITE_OTHER): Payer: BC Managed Care – PPO

## 2020-06-12 DIAGNOSIS — M5441 Lumbago with sciatica, right side: Secondary | ICD-10-CM | POA: Diagnosis not present

## 2020-06-12 DIAGNOSIS — M25551 Pain in right hip: Secondary | ICD-10-CM

## 2020-06-12 DIAGNOSIS — M545 Low back pain, unspecified: Secondary | ICD-10-CM | POA: Diagnosis not present

## 2020-06-12 MED ORDER — ONDANSETRON 8 MG PO TBDP
8.0000 mg | ORAL_TABLET | Freq: Once | ORAL | Status: AC
  Administered 2020-06-12: 8 mg via ORAL

## 2020-06-12 MED ORDER — ONDANSETRON 4 MG PO TBDP: 4.0000 mg | ORAL_TABLET | Freq: Three times a day (TID) | ORAL | 0 refills | Status: AC | PRN

## 2020-06-12 MED ORDER — BACLOFEN 10 MG PO TABS: 10.0000 mg | ORAL_TABLET | Freq: Three times a day (TID) | ORAL | 0 refills | Status: AC | PRN

## 2020-06-12 NOTE — Discharge Instructions (Signed)
Continue the prednisone and Hydrocodone.  Zofran and muscle relaxer as needed.  If you worsen or your pain is uncontrolled, go to the ER.  Follow up with Dr. Sabra Heck  Dr. Lacinda Axon

## 2020-06-12 NOTE — ED Triage Notes (Signed)
Patient states that he was shoveling yesterday and started having right sided hip and leg pain. Reports that pain was worse last night and called PCP and was sent in Hydrocodone and prednisone.

## 2020-06-12 NOTE — ED Provider Notes (Signed)
[facility]    CSN: [id number] Arrival date & time: 06/12/20  5852      History   Chief Complaint Chief Complaint  Patient presents with  . Hip Pain   HPI   65 year old male presents with the above complaint  Patient states that he was shoveling yesterday.  Subsequently developed right-sided hip pain/leg pain.  His pain worsened last night.  He was unable to get comfortable.  His primary care physician placed him on hydrocodone and prednisone.  He has taken the medication.  He has had some nausea and vomiting today after taking the medications empty stomach.  Continues to have severe pain particularly of the right hip and lateral leg.  No saddle anesthesia or incontinence.  No relieving factors.  No other complaints.  Past Medical History:  Diagnosis Date  . Benign essential hypertension 04/02/2016  . CAD (coronary artery disease)   . Carotid artery disease ([place])    stent PCI 11/1999, antioplasty 2/02, persistent chest pain  . Cholelithiasis   . Coronary artery disease 12/15/2010   Overview:   Progressive angina - onset early 2001.   08/1999 Worsening angina, stent PCI of the RCA ([place]).   11/1999 Recurrent angina, stent PCI of the LAD ([place]).   03/2000 Persistent intermittent chest discomfort Relook catheterization revealing in-stent restenosis of the LAD.   04/2000 Redo angioplasty of the LAD.   Persistent intermittent chest pain.   09/11/2000 Relook catheterizat  . Diabetes mellitus without complication ([place])   . Dyslipidemia   . GERD (gastroesophageal reflux disease)   . H/O angioplasty   . Hypertension   . Leg pain 07/27/2013   Overview:   12/2012 ABIs: R> 1.39, L. 1.40  . Other dysphagia 06/22/2016  . PONV (postoperative nausea and vomiting)    pt also says he is hard to wake up   . Schatzki's ring   . Symptomatic cholelithiasis   . Tubular adenoma 04/02/2016   Overview:  6/132    Patient Active Problem List   Diagnosis Date Noted  . Total knee  replacement status 05/19/2018  . DM type 2 with diabetic mixed hyperlipidemia ([place]) 10/28/2017  . Primary osteoarthritis of left knee 04/30/2017  . Hyperlipidemia, mixed 04/08/2017  . Schatzki's ring 12/10/2016  . Symptomatic cholelithiasis   . Other dysphagia 06/22/2016  . Benign essential hypertension 04/02/2016  . Tubular adenoma 04/02/2016  . Diabetes mellitus type 2, controlled, without complications ([place]) 77/82/4235  . Leg pain 07/27/2013  . GERD (gastroesophageal reflux disease) 08/21/2011  . Coronary artery disease 12/15/2010  . Dyslipidemia 12/15/2010    Past Surgical History:  Procedure Laterality Date  . APPENDECTOMY    . CHOLECYSTECTOMY N/A 08/29/2016   Procedure: LAPAROSCOPIC CHOLECYSTECTOMY;  Surgeon: Olean Ree, MD;  Location: [facility] ORS;  Service: General;  Laterality: N/A;  . COLONOSCOPY WITH PROPOFOL N/A 03/22/2017   Procedure: COLONOSCOPY WITH PROPOFOL;  Surgeon: Manya Silvas, MD;  Location: [facility] ENDOSCOPY;  Service: Endoscopy;  Laterality: N/A;  . colonscopy     polypectomy  . CORONARY ANGIOPLASTY WITH STENT PLACEMENT  2001 + 2013   x 2, total of 10  . ESOPHAGOGASTRODUODENOSCOPY (EGD) WITH PROPOFOL N/A 03/22/2017   Procedure: ESOPHAGOGASTRODUODENOSCOPY (EGD) WITH PROPOFOL;  Surgeon: Manya Silvas, MD;  Location: [facility] ENDOSCOPY;  Service: Endoscopy;  Laterality: N/A;  . KNEE ARTHROPLASTY Left 05/19/2018   Procedure: COMPUTER ASSISTED TOTAL KNEE ARTHROPLASTY LEFT;  Surgeon: Dereck Leep, MD;  Location: [facility] ORS;  Service: Orthopedics;  Laterality: Left;  .  KNEE ARTHROSCOPY Left        Home Medications    Prior to Admission medications   Medication Sig Start Date End Date Taking? Authorizing Provider  ALPRAZolam Duanne Moron) 0.5 MG tablet Take 0.5 mg by mouth at bedtime. 06/06/20  Yes [provider]  aspirin 325 MG tablet Take by mouth.   Yes [provider]  atorvastatin (LIPITOR) 40 MG tablet Take 40 mg by mouth daily.  08/02/16  06/12/20 Yes [provider]  baclofen (LIORESAL) 10 MG tablet Take 1 tablet (10 mg total) by mouth 3 (three) times daily as needed for muscle spasms. 06/12/20  Yes Brodey Bonn G, DO  HYDROcodone-acetaminophen (NORCO/VICODIN) 5-325 MG tablet Take 1 tablet by mouth every 6 (six) hours as needed. 06/11/20  Yes [provider]  lisinopril (PRINIVIL,ZESTRIL) 20 MG tablet Take 20 mg by mouth daily.  08/02/16  Yes [provider]  magnesium oxide (MAG-OX) 400 MG tablet Take by mouth.   Yes [provider]  metFORMIN (GLUCOPHAGE) 500 MG tablet Take 500-1,000 mg by mouth 2 (two) times daily with a meal. Take 1000 mg by mouth in the morning and take 500 mg by mouth in the evening.   Yes [provider]  nitroGLYCERIN (NITROSTAT) 0.4 MG SL tablet Place 0.4 mg under the tongue every 5 (five) minutes as needed for chest pain.   Yes [provider]  omeprazole (PRILOSEC) 20 MG capsule Take 20 mg by mouth daily.    Yes [provider]  ondansetron (ZOFRAN ODT) 4 MG disintegrating tablet Take 1 tablet (4 mg total) by mouth every 8 (eight) hours as needed for nausea or vomiting. 06/12/20  Yes Charlisha Market G, DO  predniSONE (DELTASONE) 20 MG tablet Take by mouth. 06/11/20  Yes [provider]  sertraline (ZOLOFT) 50 MG tablet Take 1 tablet by mouth daily. 05/30/20  Yes [provider]  enoxaparin (LOVENOX) 40 MG/0.4ML injection Inject 0.4 mLs (40 mg total) into the skin daily for 14 days. 05/22/18 06/12/20  Watt Climes, PA    Family History Family History  Problem Relation Age of Onset  . Colon polyps Mother   . CAD Father   . Heart attack Father     Social History Social History   Tobacco Use  . Smoking status: Never Smoker  . Smokeless tobacco: Never Used  Vaping Use  . Vaping Use: Never used  Substance Use Topics  . Alcohol use: Yes    Alcohol/week: 0.0 - 3.0 standard drinks  . Drug use: No     Allergies   Patient has no  known allergies.   Review of Systems Review of Systems Per HPI  Physical Exam Triage Vital Signs ED Triage Vitals  Enc Vitals Group     BP 06/12/20 0850 (!) 151/96     Pulse Rate 06/12/20 0850 64     Resp 06/12/20 0850 18     Temp 06/12/20 0850 98 F (36.7 C)     Temp Source 06/12/20 0850 Oral     SpO2 06/12/20 0850 99 %     Weight 06/12/20 0846 188 lb 15 oz (85.7 kg)     Height 06/12/20 0846 5\' 11"  (1.803 m)     Head Circumference --      Peak Flow --      Pain Score 06/12/20 0846 6     Pain Loc --      Pain Edu? --      Excl. in [place]? --  Updated Vital Signs BP (!) 151/96 (BP Location: Left Arm)   Pulse 64   Temp 98 F (36.7 C) (Oral)   Resp 18   Ht 5\' 11"  (1.803 m)   Wt 85.7 kg   SpO2 99%   BMI 26.35 kg/m   Visual Acuity Right Eye Distance:   Left Eye Distance:   Bilateral Distance:    Right Eye Near:   Left Eye Near:    Bilateral Near:     Physical Exam Vitals and nursing note reviewed.  Constitutional:      Comments: Appears uncomfortable but in NAD.  HENT:     Head: Normocephalic and atraumatic.  Eyes:     General:        Right eye: No discharge.        Left eye: No discharge.     Conjunctiva/sclera: Conjunctivae normal.  Cardiovascular:     Rate and Rhythm: Normal rate and regular rhythm.  Pulmonary:     Effort: Pulmonary effort is normal.     Breath sounds: Normal breath sounds. No wheezing, rhonchi or rales.  Musculoskeletal:     Comments: Mild decreased range of motion of the right hip and internal rotation and external rotation.  No significant tenderness of the lumbar spine.  Neurological:     Mental Status: He is alert.  Psychiatric:        Mood and Affect: Mood normal.        Behavior: Behavior normal.    UC Treatments / Results  Labs (all labs ordered are listed, but only abnormal results are displayed) Labs Reviewed - No data to display  EKG   Radiology DG Lumbar Spine Complete  Result Date: 06/12/2020 CLINICAL  DATA:  Back pain. EXAM: LUMBAR SPINE - COMPLETE 4+ VIEW COMPARISON:  No comparison studies available. FINDINGS: Bones are diffusely demineralized. No evidence for an acute fracture. Loss of disc height noted L5-S1 with endplate degeneration. SI joints unremarkable. IMPRESSION: Degenerative disc disease at L5-S1. No acute bony abnormality. Electronically Signed   By: Misty Stanley M.D.   On: 06/12/2020 09:58   DG Hip Unilat W or Wo Pelvis 2-3 Views Right  Result Date: 06/12/2020 CLINICAL DATA:  Right-sided hip pain. EXAM: DG HIP (WITH OR WITHOUT PELVIS) 2-3V RIGHT COMPARISON:  None. FINDINGS: There is no evidence of hip fracture or dislocation. There is no evidence of arthropathy or other focal bone abnormality. IMPRESSION: Negative. Electronically Signed   By: Misty Stanley M.D.   On: 06/12/2020 09:59    Procedures Procedures (including critical care time)  Medications Ordered in UC Medications  ondansetron (ZOFRAN-ODT) disintegrating tablet 8 mg (8 mg Oral Given 06/12/20 5916)    Initial Impression / Assessment and Plan / UC Course  I have reviewed the triage vital signs and the nursing notes.  Pertinent labs & imaging results that were available during my care of the patient were reviewed by me and considered in my medical decision making (see chart for details).    65 year old male presents with right-sided back pain and hip pain.  I suspect that most of this is coming from the lumbar spine.  X-rays were obtained and were independently reviewed by me.  Interpretation: Degenerative changes noted in the lumbar spine.  No other significant findings.  Advised patient to continue recent prescribed prednisone and hydrocodone.  Zofran as needed for nausea.  Baclofen as directed for spasm.  Advised to follow-up with PCP.  May need MRI.  Final Clinical Impressions(s) / UC  Diagnoses   Final diagnoses:  Acute right-sided low back pain with right-sided sciatica     Discharge Instructions      Continue the prednisone and Hydrocodone.  Zofran and muscle relaxer as needed.  If you worsen or your pain is uncontrolled, go to the ER.  Follow up with Dr. Sabra Heck  Dr. Lacinda Axon    ED Prescriptions    Medication Sig [place]. Provider   ondansetron (ZOFRAN ODT) 4 MG disintegrating tablet Take 1 tablet (4 mg total) by mouth every 8 (eight) hours as needed for nausea or vomiting. 20 tablet Rawlins Stuard G, DO   baclofen (LIORESAL) 10 MG tablet Take 1 tablet (10 mg total) by mouth 3 (three) times daily as needed for muscle spasms. 1 each Coral Spikes, DO     I have reviewed the PDMP during this encounter.   Coral Spikes, [place] 06/12/20 1137

## 2020-06-23 ENCOUNTER — Other Ambulatory Visit: Payer: Self-pay

## 2020-06-23 ENCOUNTER — Other Ambulatory Visit: Payer: Self-pay | Admitting: Neurosurgery

## 2020-06-23 ENCOUNTER — Ambulatory Visit
Admission: RE | Admit: 2020-06-23 | Discharge: 2020-06-23 | Disposition: A | Discharge status: Home / Self Care | Payer: BC Managed Care – PPO | Source: Ambulatory Visit | Attending: Neurosurgery | Admitting: Neurosurgery

## 2020-06-23 DIAGNOSIS — M21371 Foot drop, right foot: Secondary | ICD-10-CM

## 2020-08-11 ENCOUNTER — Emergency Department
Admission: EM | Admit: 2020-08-11 | Discharge: 2020-08-11 | Disposition: A | Discharge status: Home / Self Care | Payer: BC Managed Care – PPO | Attending: Emergency Medicine | Admitting: Emergency Medicine

## 2020-08-11 ENCOUNTER — Other Ambulatory Visit: Payer: Self-pay

## 2020-08-11 ENCOUNTER — Encounter: Payer: Self-pay | Admitting: Emergency Medicine

## 2020-08-11 ENCOUNTER — Emergency Department: Payer: BC Managed Care – PPO

## 2020-08-11 DIAGNOSIS — Z79899 Other long term (current) drug therapy: Secondary | ICD-10-CM | POA: Diagnosis not present

## 2020-08-11 DIAGNOSIS — I10 Essential (primary) hypertension: Secondary | ICD-10-CM | POA: Diagnosis not present

## 2020-08-11 DIAGNOSIS — I2510 Atherosclerotic heart disease of native coronary artery without angina pectoris: Secondary | ICD-10-CM | POA: Insufficient documentation

## 2020-08-11 DIAGNOSIS — E119 Type 2 diabetes mellitus without complications: Secondary | ICD-10-CM | POA: Diagnosis not present

## 2020-08-11 DIAGNOSIS — Z96652 Presence of left artificial knee joint: Secondary | ICD-10-CM | POA: Diagnosis not present

## 2020-08-11 DIAGNOSIS — S0102XA Laceration with foreign body of scalp, initial encounter: Secondary | ICD-10-CM | POA: Diagnosis not present

## 2020-08-11 DIAGNOSIS — Z7982 Long term (current) use of aspirin: Secondary | ICD-10-CM | POA: Diagnosis not present

## 2020-08-11 DIAGNOSIS — Z7984 Long term (current) use of oral hypoglycemic drugs: Secondary | ICD-10-CM | POA: Diagnosis not present

## 2020-08-11 DIAGNOSIS — Y92009 Unspecified place in unspecified non-institutional (private) residence as the place of occurrence of the external cause: Secondary | ICD-10-CM | POA: Diagnosis not present

## 2020-08-11 DIAGNOSIS — S0990XA Unspecified injury of head, initial encounter: Secondary | ICD-10-CM | POA: Diagnosis present

## 2020-08-11 DIAGNOSIS — S0101XA Laceration without foreign body of scalp, initial encounter: Secondary | ICD-10-CM

## 2020-08-11 DIAGNOSIS — Z23 Encounter for immunization: Secondary | ICD-10-CM | POA: Insufficient documentation

## 2020-08-11 DIAGNOSIS — W228XXA Striking against or struck by other objects, initial encounter: Secondary | ICD-10-CM | POA: Insufficient documentation

## 2020-08-11 MED ORDER — LIDOCAINE-EPINEPHRINE (PF) 1 %-1:200000 IJ SOLN
10.0000 mL | Freq: Once | INTRAMUSCULAR | Status: AC
  Administered 2020-08-11: 10 mL
  Filled 2020-08-11: qty 10

## 2020-08-11 MED ORDER — CEPHALEXIN 500 MG PO CAPS: 1000.0000 mg | ORAL_CAPSULE | Freq: Two times a day (BID) | ORAL | 0 refills | Status: AC

## 2020-08-11 MED ORDER — TETANUS-DIPHTH-ACELL PERTUSSIS 5-2.5-18.5 LF-MCG/0.5 IM SUSY
0.5000 mL | PREFILLED_SYRINGE | Freq: Once | INTRAMUSCULAR | Status: AC
  Administered 2020-08-11: 0.5 mL via INTRAMUSCULAR
  Filled 2020-08-11: qty 0.5
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
## 2020-08-11 NOTE — ED Provider Notes (Signed)
[facility] Emergency Department Provider Note  ____________________________________________  Time seen: Approximately 9:21 PM  I have reviewed the triage vital signs and the nursing notes.   HISTORY  Chief Complaint Head Laceration    HPI Jeremy Luna is a 65 y.o. male who presents the emergency department for evaluation of a scalp laceration.  Patient stood up too quickly while picking up some limbs that he had cut out of a tree making contact with one of the exposed branches causing a laceration to the right parietal scalp.  It bled heavily initially but patient had bleeding controlled prior to arrival.  He was complaining of some pain in the area but no visual changes, loss of consciousness.  No neck pain.  No other complaints.  Medical history as described below.  Unsure of last tetanus shot.       Past Medical History:  Diagnosis Date   Benign essential hypertension 04/02/2016   CAD (coronary artery disease)    Carotid artery disease ([place])    stent PCI 11/1999, antioplasty 2/02, persistent chest pain   Cholelithiasis    Coronary artery disease 12/15/2010   Overview:   Progressive angina - onset early 2001.   08/1999 Worsening angina, stent PCI of the RCA ([place]).   11/1999 Recurrent angina, stent PCI of the LAD ([place]).   03/2000 Persistent intermittent chest discomfort Relook catheterization revealing in-stent restenosis of the LAD.   04/2000 Redo angioplasty of the LAD.   Persistent intermittent chest pain.   09/11/2000 Relook catheterizat   Diabetes mellitus without complication ([place])    Dyslipidemia    GERD (gastroesophageal reflux disease)    H/O angioplasty    Hypertension    Leg pain 07/27/2013   Overview:   12/2012 ABIs: R> 1.39, L. 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[place]) 10/28/2017   Primary osteoarthritis of left knee 04/30/2017   Hyperlipidemia, mixed 04/08/2017   Schatzki's ring 12/10/2016   Symptomatic cholelithiasis    Other dysphagia 06/22/2016   Benign essential hypertension 04/02/2016   Tubular adenoma 04/02/2016   Diabetes mellitus type 2, controlled, without complications ([place]) 64/40/3474   Leg pain 07/27/2013   GERD (gastroesophageal reflux disease) 08/21/2011   Coronary artery disease 12/15/2010   Dyslipidemia 12/15/2010    Past Surgical History:  Procedure Laterality Date   APPENDECTOMY     CHOLECYSTECTOMY N/A 08/29/2016   Procedure: LAPAROSCOPIC CHOLECYSTECTOMY;  Surgeon: Olean Ree, MD;  Location: [facility] ORS;  Service: General;  Laterality: N/A;   COLONOSCOPY WITH PROPOFOL N/A 03/22/2017   Procedure: COLONOSCOPY WITH PROPOFOL;  Surgeon: Manya Silvas, MD;  Location: [facility] ENDOSCOPY;  Service: Endoscopy;  Laterality: N/A;   colonscopy     polypectomy   CORONARY ANGIOPLASTY WITH STENT PLACEMENT  2001 + 2013   x 2, total of 10   ESOPHAGOGASTRODUODENOSCOPY (EGD) WITH PROPOFOL N/A 03/22/2017   Procedure: ESOPHAGOGASTRODUODENOSCOPY (EGD) WITH PROPOFOL;  Surgeon: Manya Silvas, MD;  Location[facility] ENDOSCOPY;  Service: Endoscopy;  Laterality: N/A;   KNEE ARTHROPLASTY Left 05/19/2018   Procedure: COMPUTER ASSISTED TOTAL KNEE ARTHROPLASTY LEFT;  Surgeon: Skip Estimable  P, MD;  Location: [facility] ORS;  Service: Orthopedics;  Laterality: Left;   KNEE ARTHROSCOPY Left     Prior to Admission medications   Medication Sig Start Date End Date Taking? Authorizing Provider  cephALEXin (KEFLEX) 500 MG capsule Take 2 capsules (1,000 mg total) by mouth 2 (two) times daily. 08/11/20  Yes Olufemi Mofield, Charline Bills, PA-C  ALPRAZolam Duanne Moron) 0.5 MG tablet Take 0.5 mg by mouth at bedtime. 06/06/20    [provider]  aspirin 325 MG tablet Take by mouth.    [provider]  atorvastatin (LIPITOR) 40 MG tablet Take 40 mg by mouth daily.  08/02/16 06/12/20  [provider]  baclofen (LIORESAL) 10 MG tablet Take 1 tablet (10 mg total) by mouth 3 (three) times daily as needed for muscle spasms. 06/12/20   Coral Spikes, DO  HYDROcodone-acetaminophen (NORCO/VICODIN) 5-325 MG tablet Take 1 tablet by mouth every 6 (six) hours as needed. 06/11/20   [provider]  lisinopril (PRINIVIL,ZESTRIL) 20 MG tablet Take 20 mg by mouth daily.  08/02/16   [provider]  magnesium oxide (MAG-OX) 400 MG tablet Take by mouth.    [provider]  metFORMIN (GLUCOPHAGE) 500 MG tablet Take 500-1,000 mg by mouth 2 (two) times daily with a meal. Take 1000 mg by mouth in the morning and take 500 mg by mouth in the evening.    [provider]  nitroGLYCERIN (NITROSTAT) 0.4 MG SL tablet Place 0.4 mg under the tongue every 5 (five) minutes as needed for chest pain.    [provider]  omeprazole (PRILOSEC) 20 MG capsule Take 20 mg by mouth daily.     [provider]  ondansetron (ZOFRAN ODT) 4 MG disintegrating tablet Take 1 tablet (4 mg total) by mouth every 8 (eight) hours as needed for nausea or vomiting. 06/12/20   Coral Spikes, DO  predniSONE (DELTASONE) 20 MG tablet Take by mouth. 06/11/20   [provider]  sertraline (ZOLOFT) 50 MG tablet Take 1 tablet by mouth daily. 05/30/20   [provider]  enoxaparin (LOVENOX) 40 MG/0.4ML injection Inject 0.4 mLs (40 mg total) into the skin daily for 14 days. 05/22/18 06/12/20  Watt Climes, PA    Allergies Patient has no known allergies.  Family History  Problem Relation Age of Onset   Colon polyps Mother    CAD Father    Heart attack Father     Social History Social History   Tobacco Use   Smoking status: Never   Smokeless tobacco: Never  Vaping Use   Vaping Use: Never  used  Substance Use Topics   Alcohol use: Yes    Alcohol/week: 0.0 - 3.0 standard drinks   Drug use: No     Review of Systems  Constitutional: No fever/chills Eyes: No visual changes. No discharge ENT: No upper respiratory complaints. Cardiovascular: no chest pain. Respiratory: no cough. No SOB. Gastrointestinal: No abdominal pain.  No nausea, no vomiting.  No diarrhea.  No constipation. Musculoskeletal: Negative for musculoskeletal pain. Skin: Positive for right parietal scalp laceration Neurological: Negative for headaches, focal weakness or numbness.  10 System ROS otherwise negative.  ____________________________________________   PHYSICAL EXAM:  VITAL SIGNS: ED Triage Vitals  Enc Vitals Group     BP 08/11/20 2059 (!) 156/79     Pulse Rate 08/11/20 2059 73     Resp 08/11/20 2059 18     Temp 08/11/20 2059 98.5 F (36.9 C)  Temp Source 08/11/20 2059 Oral     SpO2 08/11/20 2059 97 %     Weight 08/11/20 2101 184 lb (83.5 kg)     Height 08/11/20 2101 5\' 11"  (1.803 m)     Head Circumference --      Peak Flow --      Pain Score --      Pain Loc --      Pain Edu? --      Excl. in [place]? --      Constitutional: Alert and oriented. Well appearing and in no acute distress. Eyes: Conjunctivae are normal. PERRL. EOMI. Head: Patient has a laceration to the right parietal scalp with no active bleeding.  Foreign body is appreciated consistent with small pieces of bark and debris.  No surrounding erythema or edema.  No tenderness to underlying cyst goal.  No battle signs, raccoon eyes, serosanguineous fluid drainage from the ears or nares. ENT:      Ears:       Nose: No congestion/rhinnorhea.      Mouth/Throat: Mucous membranes are moist.  Neck: No stridor.  Neck is supple full range of motion with no tenderness on exam  Cardiovascular: Normal rate, regular rhythm. Normal S1 and S2.  Good peripheral circulation. Respiratory: Normal respiratory effort without tachypnea or  retractions. Lungs CTAB. Good air entry to the bases with no decreased or absent breath sounds. Musculoskeletal: Full range of motion to all extremities. No gross deformities appreciated. Neurologic:  Normal speech and language. No gross focal neurologic deficits are appreciated.  Cranial nerves II to XII grossly intact. Skin:  Skin is warm, dry and intact. No rash noted. Psychiatric: Mood and affect are normal. Speech and behavior are normal. Patient exhibits appropriate insight and judgement.   ____________________________________________   LABS (all labs ordered are listed, but only abnormal results are displayed)  Labs Reviewed - No data to display ____________________________________________  EKG   ____________________________________________  RADIOLOGY I personally viewed and evaluated these images as part of my medical decision making, as well as reviewing the written report by the radiologist.  ED Provider Interpretation: No acute findings on CT to include radiopaque foreign body, skull fracture or intracranial hemorrhage  CT Head Wo Contrast  Result Date: 08/11/2020 CLINICAL DATA:  Status post trauma. EXAM: CT HEAD WITHOUT CONTRAST TECHNIQUE: Contiguous axial images were obtained from the base of the skull through the vertex without intravenous contrast. COMPARISON:  None. FINDINGS: Brain: No evidence of acute infarction, hemorrhage, hydrocephalus, extra-axial collection or mass lesion/mass effect. Vascular: No hyperdense vessel or unexpected calcification. Skull: Normal. Negative for fracture or focal lesion. Sinuses/Orbits: No acute finding. Other: A small scalp soft tissue defect is seen along the vertex on the right. IMPRESSION: No acute intracranial abnormality. Electronically Signed   By: Virgina Norfolk M.D.   On: 08/11/2020 21:52    ____________________________________________    PROCEDURES  Procedure(s) performed:    Marland KitchenMarland KitchenLaceration Repair  Date/Time: 08/11/2020  10:55 PM Performed by: Darletta Moll, PA-C Authorized by: Darletta Moll, PA-C   Consent:    Consent obtained:  Verbal   Consent given by:  Patient   Risks discussed:  Pain, retained foreign body and infection Universal protocol:    Procedure explained and questions answered to patient or proxy's satisfaction: yes     Patient identity confirmed:  Verbally with patient Anesthesia:    Anesthesia method:  Local infiltration   Local anesthetic:  Lidocaine 1% WITH epi Laceration details:    Location:  Scalp   Scalp location:  R parietal   Length (cm):  6 Pre-procedure details:    Preparation:  Patient was prepped and draped in usual sterile fashion and imaging obtained to evaluate for foreign bodies Exploration:    Imaging outcome: foreign body not noted     Wound exploration: entire depth of wound visualized     Wound extent: foreign bodies/material     Wound extent: no underlying fracture noted     Foreign bodies/material:  Bark Treatment:    Area cleansed with:  Shur-Clens and saline   Amount of cleaning:  Extensive   Irrigation solution:  Sterile saline   Irrigation volume:  1L   Irrigation method:  Pressure wash and syringe   Visualized foreign bodies/material removed: yes   Skin repair:    Repair method:  Staples   Number of staples:  6 Approximation:    Approximation:  Close Repair type:    Repair type:  Intermediate Post-procedure details:    Dressing:  Open (no dressing)   Procedure completion:  Tolerated well, no immediate complications    Medications  Tdap (BOOSTRIX) injection 0.5 mL (0.5 mLs Intramuscular Given 08/11/20 2224)  lidocaine-EPINEPHrine (XYLOCAINE-EPINEPHrine) 1 %-1:200000 (PF) injection 10 mL (10 mLs Infiltration Given by Other 08/11/20 2225)     ____________________________________________   INITIAL IMPRESSION / ASSESSMENT AND PLAN / ED COURSE  Pertinent labs & imaging results that were available during my care of the patient  were reviewed by me and considered in my medical decision making (see chart for details).  Review of the [place] CSRS was performed in accordance of the [place] prior to dispensing any controlled drugs.           Patient's diagnosis is consistent with scalp laceration.  Patient presented to the emergency department after sustaining a laceration to the right parietal scalp.  Small amount of foreign body was appreciated and this was successfully removed prior to closure.  Wound closure was performed as described above with no complications.  Wound management at home is discussed with the patient and his wife.  Patient will be placed on antibiotics prophylactically given the contamination of the wound.  Imaging revealed no underlying skull fracture or intracranial hemorrhage.  Return precautions discussed with the patient.  Otherwise follow-up primary care in 1 week for staple removal..  Patient is given ED precautions to return to the ED for any worsening or new symptoms.     ____________________________________________  FINAL CLINICAL IMPRESSION(S) / ED DIAGNOSES  Final diagnoses:  Laceration of scalp, initial encounter      NEW MEDICATIONS STARTED DURING THIS VISIT:  ED Discharge Orders          Ordered    cephALEXin (KEFLEX) 500 MG capsule  2 times daily        08/11/20 2258                This chart was dictated using voice recognition software/Dragon. Despite best efforts to proofread, errors can occur which can change the meaning. Any change was purely unintentional.    Darletta Moll, PA-C 08/11/20 2332    Nance Pear, MD 08/11/20 [phone]

## 2021-11-13 IMAGING — CR DG HIP (WITH OR WITHOUT PELVIS) 2-3V*R*
3 series · 3 of 3 positions shown · non-contrast
Comparison: None.

CLINICAL DATA: Right-sided hip pain.

EXAM:
DG HIP (WITH OR WITHOUT PELVIS) 2-3V RIGHT

[pelvis ap]
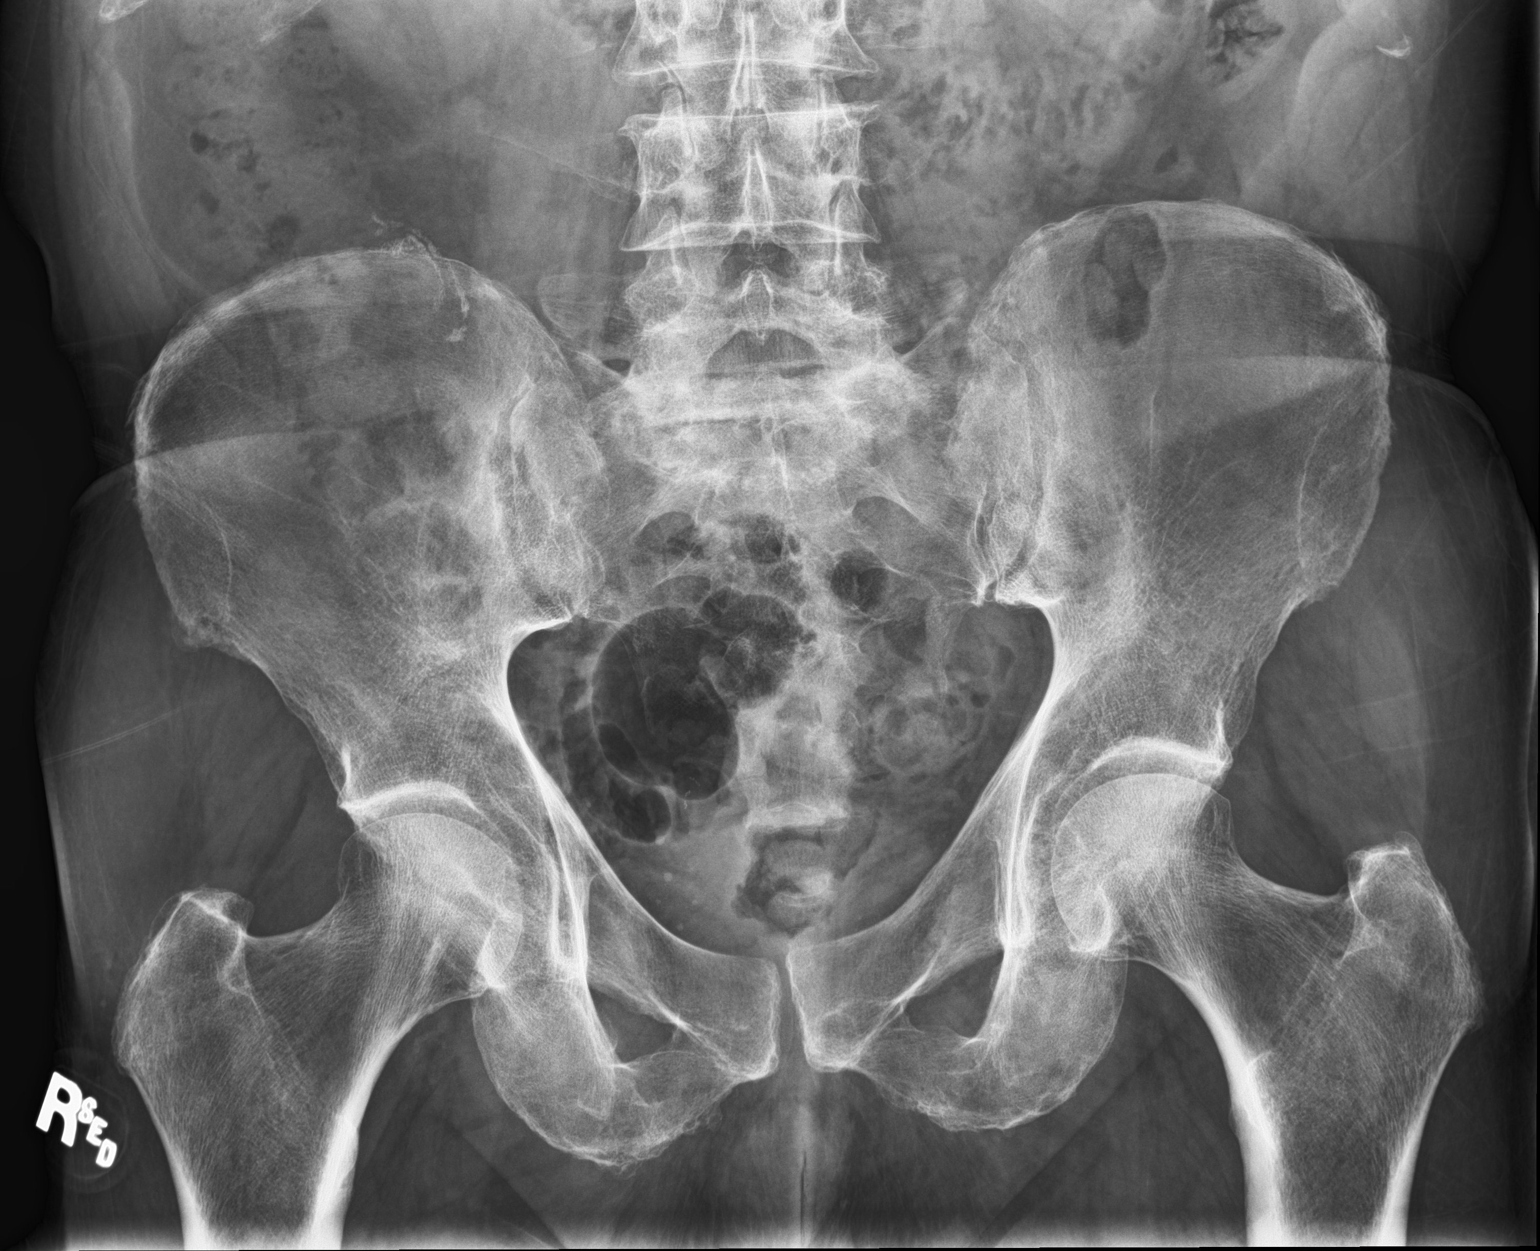

[hip ap]
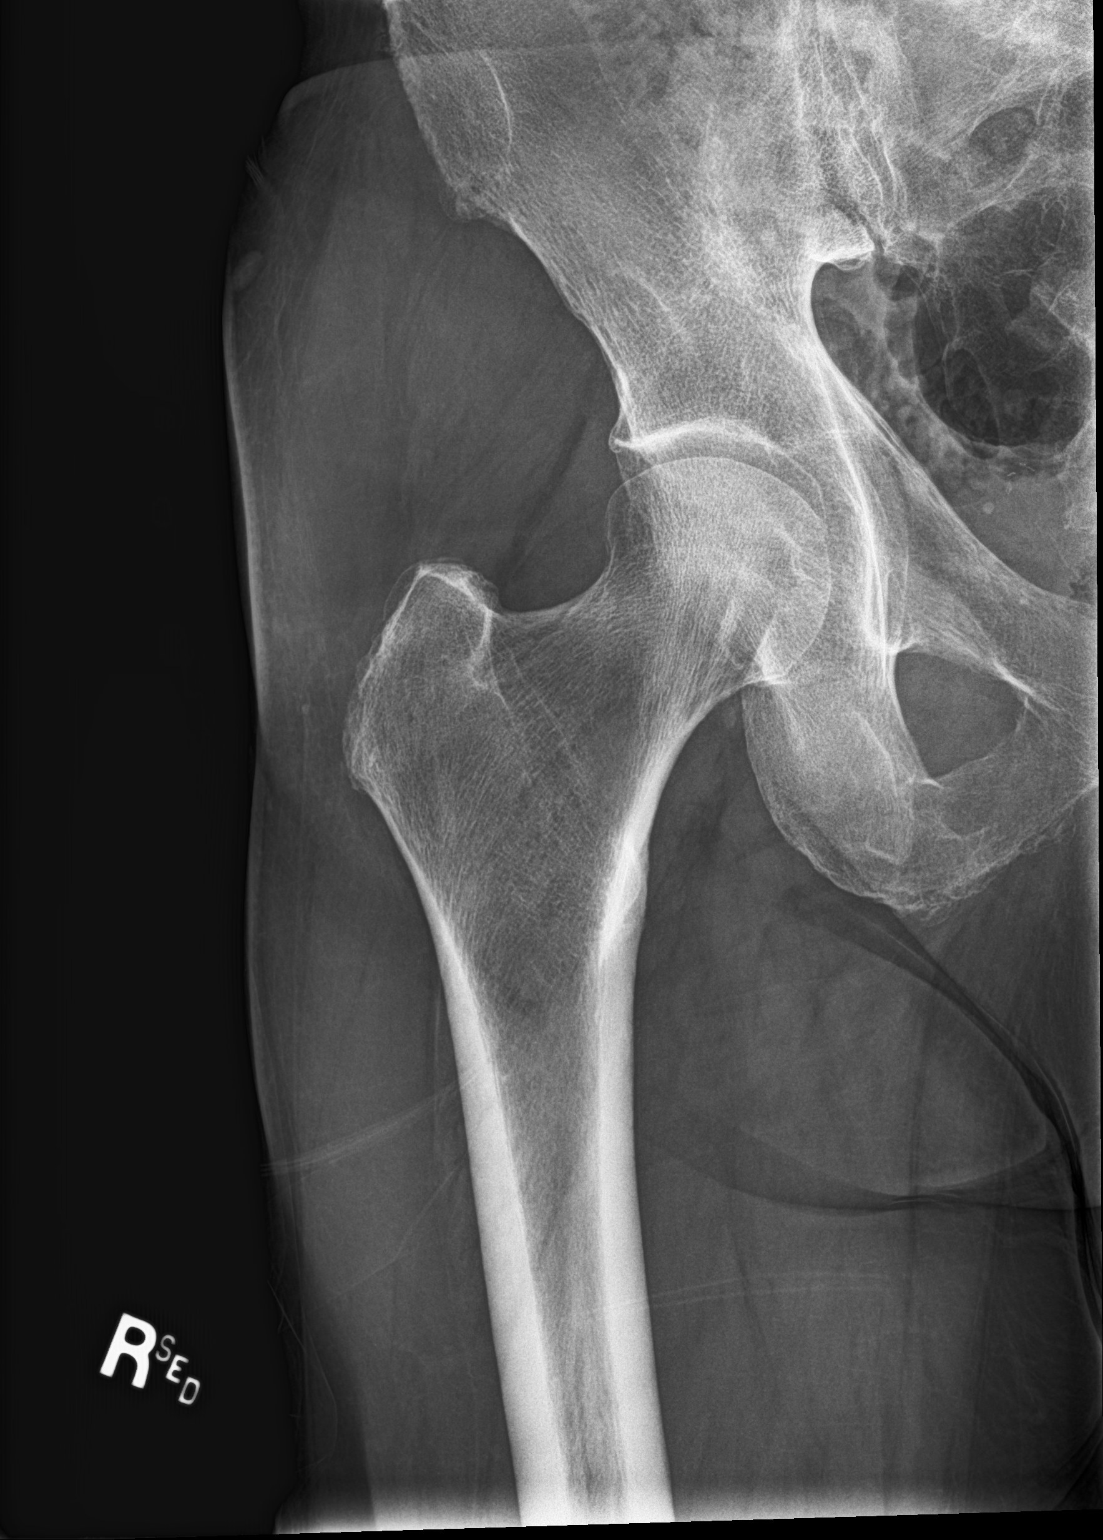

[hip lat]
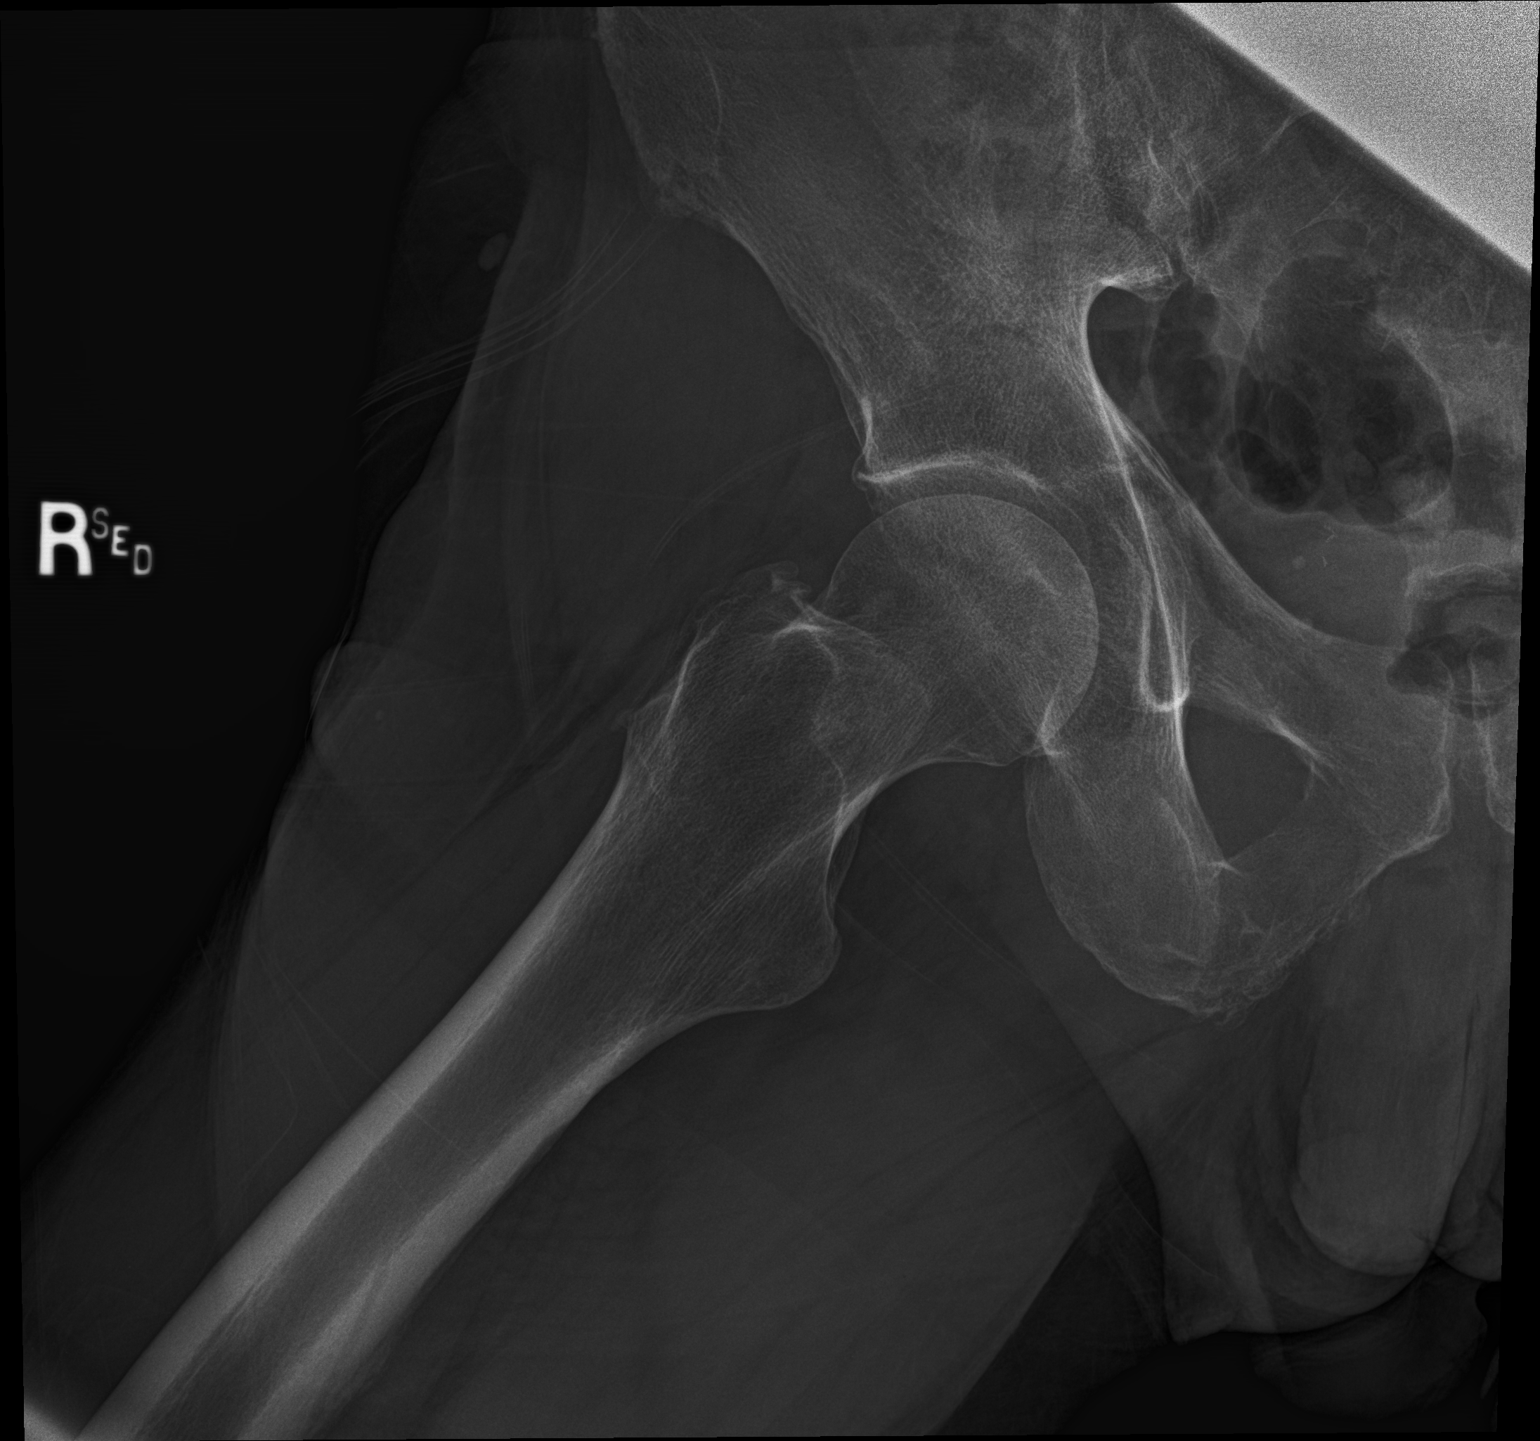

[3 of 3 positions shown; findings below may reference images not displayed]

FINDINGS: There is no evidence of hip fracture or dislocation. There is no
evidence of arthropathy or other focal bone abnormality.
IMPRESSION: Negative.

## 2021-11-13 IMAGING — CR DG LUMBAR SPINE COMPLETE 4+V
5 series · 5 of 5 positions shown · non-contrast
Comparison: No comparison studies available.

CLINICAL DATA: Back pain.

EXAM:
LUMBAR SPINE - COMPLETE 4+ VIEW

[l-spine ap]
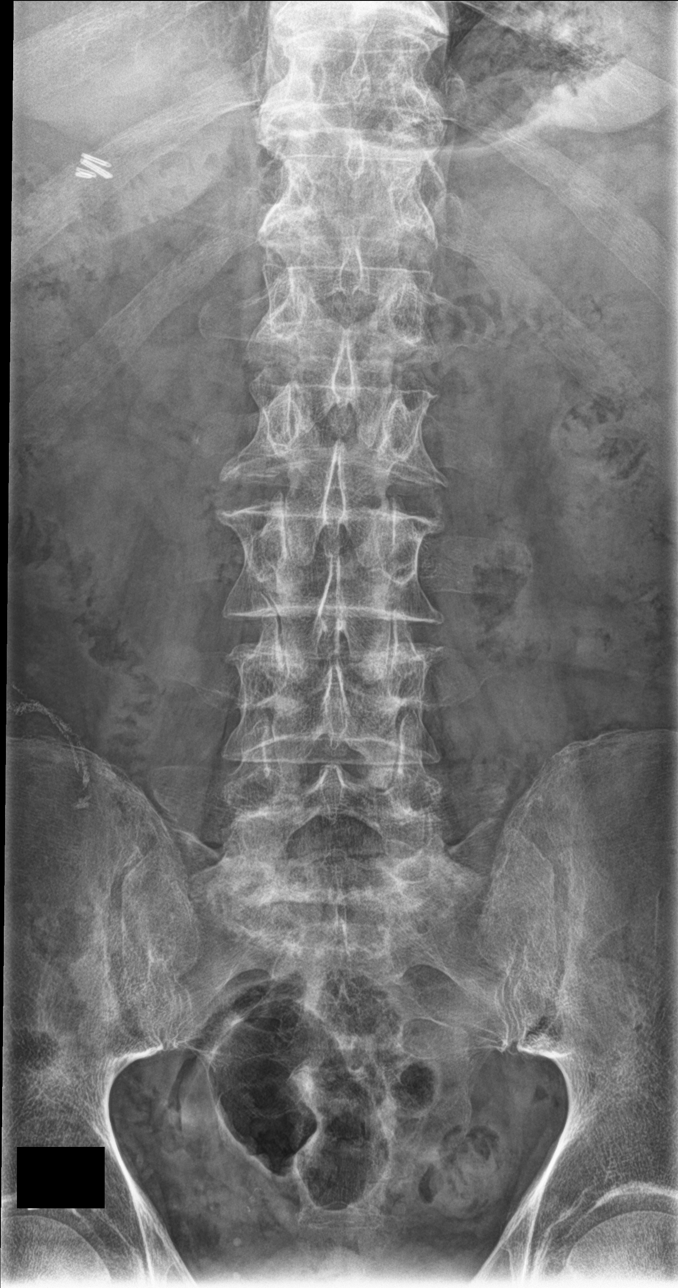

[l-spine obl (1 of 2)]
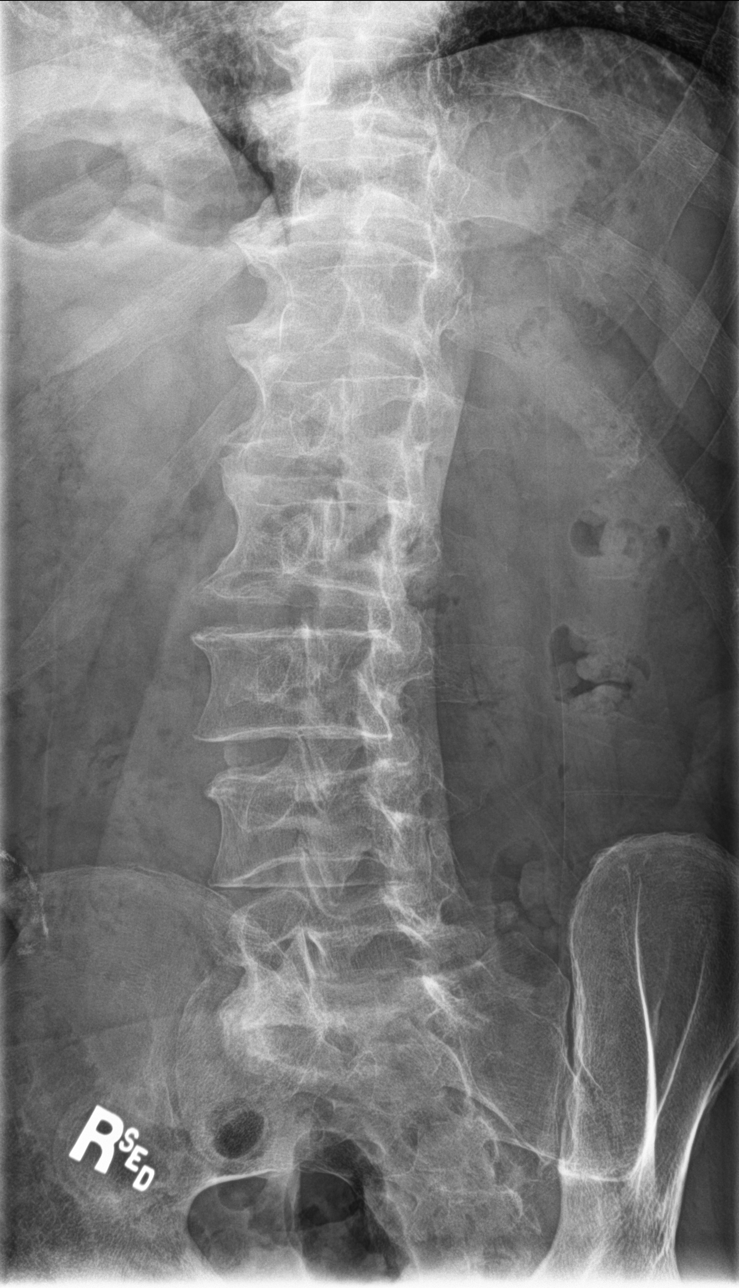

[l-spine obl (2 of 2)]
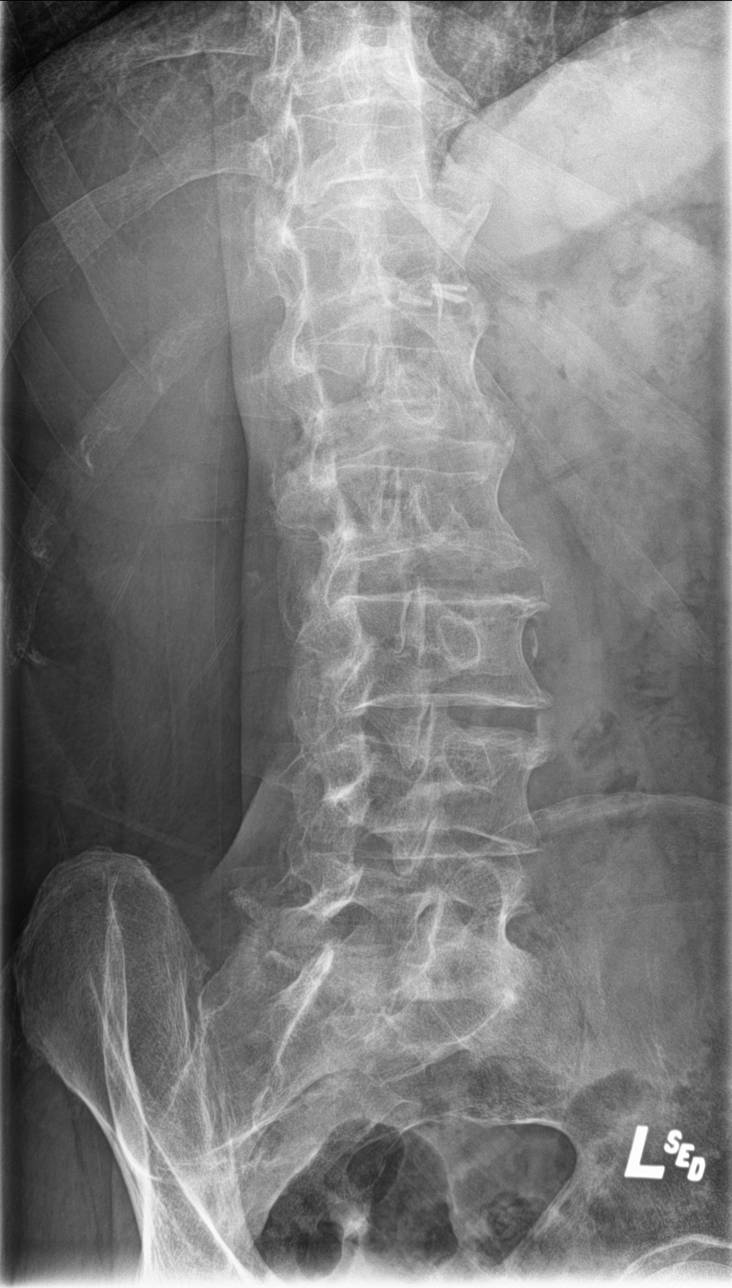

[l-spine lat]
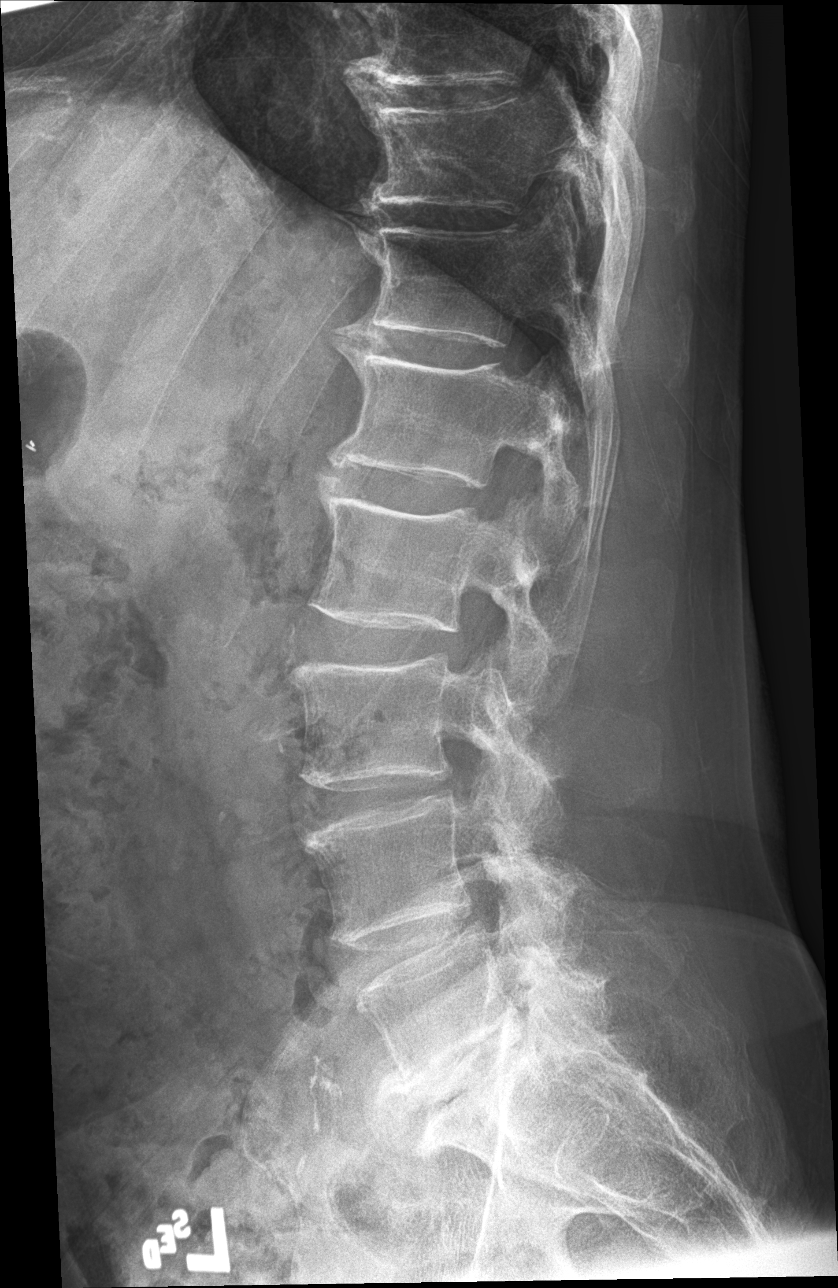

[l-spine spot]
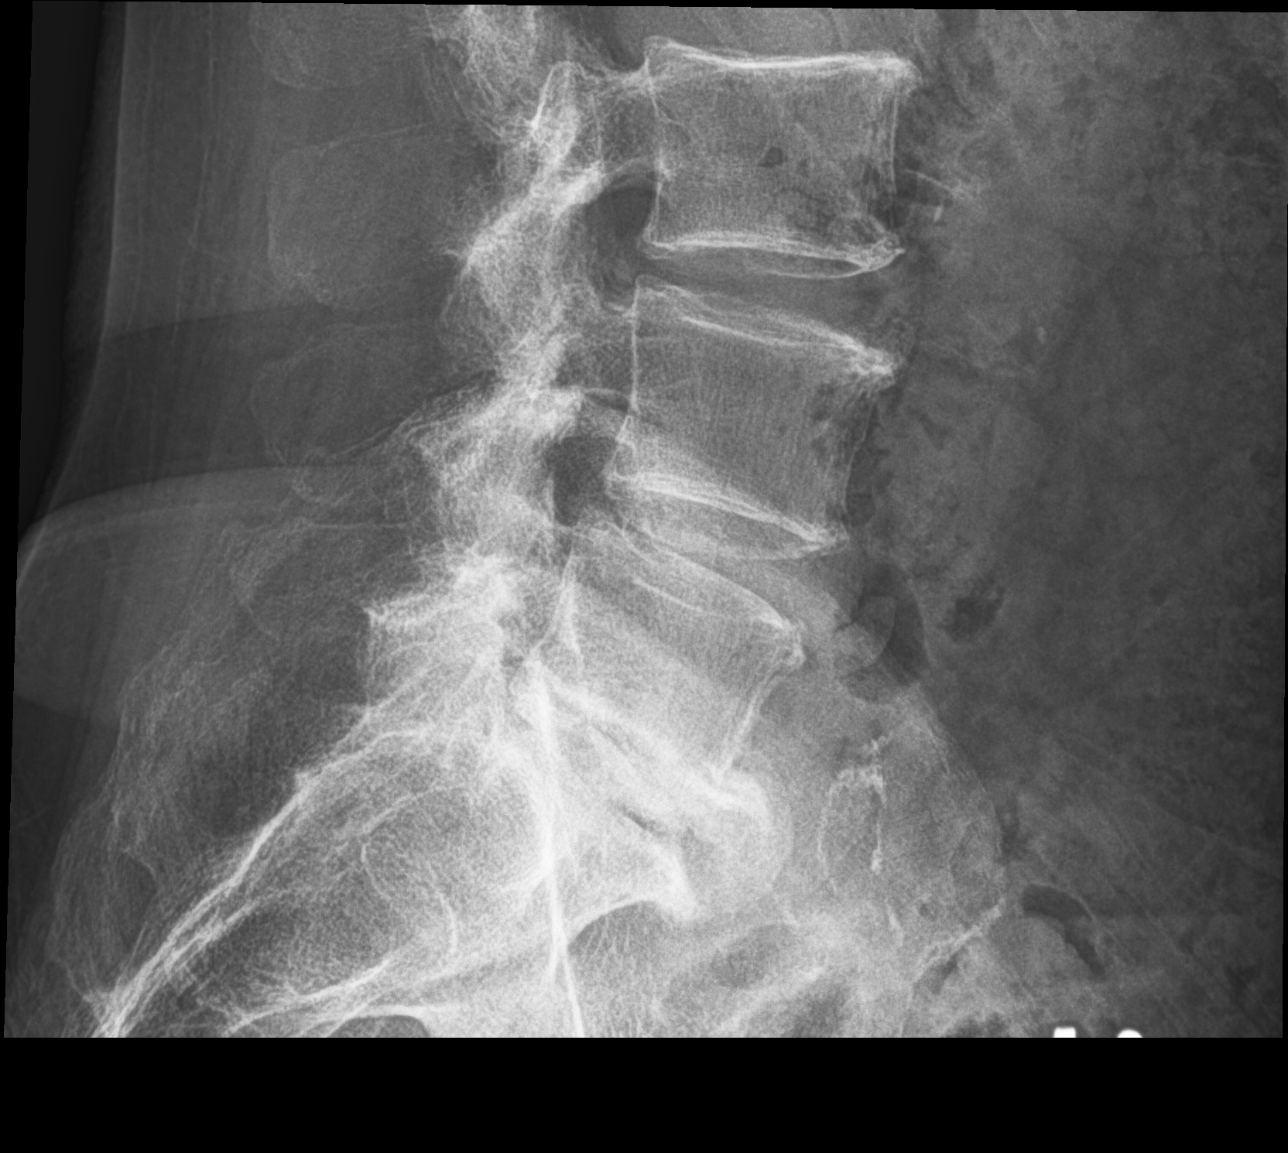

[5 of 5 positions shown; findings below may reference images not displayed]

FINDINGS: Bones are diffusely demineralized. No evidence for an acute
fracture. Loss of disc height noted L5-S1 with endplate
degeneration. SI joints unremarkable.
IMPRESSION: Degenerative disc disease at L5-S1. No acute bony abnormality.

## 2021-12-20 ENCOUNTER — Other Ambulatory Visit: Payer: Self-pay | Admitting: Internal Medicine

## 2021-12-20 DIAGNOSIS — M5116 Intervertebral disc disorders with radiculopathy, lumbar region: Secondary | ICD-10-CM

## 2021-12-26 ENCOUNTER — Ambulatory Visit
Admission: RE | Admit: 2021-12-26 | Discharge: 2021-12-26 | Disposition: A | Discharge status: Home / Self Care | Payer: BC Managed Care – PPO | Source: Ambulatory Visit | Attending: Internal Medicine | Admitting: Internal Medicine

## 2021-12-26 DIAGNOSIS — M5116 Intervertebral disc disorders with radiculopathy, lumbar region: Secondary | ICD-10-CM | POA: Diagnosis present

## 2023-01-08 DIAGNOSIS — I471 Supraventricular tachycardia, unspecified: Secondary | ICD-10-CM | POA: Insufficient documentation

## 2023-09-09 DIAGNOSIS — I214 Non-ST elevation (NSTEMI) myocardial infarction: Secondary | ICD-10-CM | POA: Insufficient documentation

## 2023-11-05 ENCOUNTER — Encounter: Attending: Internal Medicine | Admitting: *Deleted

## 2023-11-05 DIAGNOSIS — Z48812 Encounter for surgical aftercare following surgery on the circulatory system: Secondary | ICD-10-CM | POA: Insufficient documentation

## 2023-11-05 DIAGNOSIS — I252 Old myocardial infarction: Secondary | ICD-10-CM | POA: Insufficient documentation

## 2023-11-05 DIAGNOSIS — Z955 Presence of coronary angioplasty implant and graft: Secondary | ICD-10-CM | POA: Insufficient documentation

## 2023-11-05 DIAGNOSIS — I214 Non-ST elevation (NSTEMI) myocardial infarction: Secondary | ICD-10-CM

## 2023-11-05 NOTE — Progress Notes (Signed)
 Initial phone call completed. Diagnosis can be found in [facility] 7/5. EP Orientation scheduled for Monday 9/8 10am.

## 2023-11-11 ENCOUNTER — Encounter

## 2023-11-11 VITALS — Ht 71.26 in | Wt 185.4 lb

## 2023-11-11 DIAGNOSIS — Z955 Presence of coronary angioplasty implant and graft: Secondary | ICD-10-CM

## 2023-11-11 DIAGNOSIS — I214 Non-ST elevation (NSTEMI) myocardial infarction: Secondary | ICD-10-CM

## 2023-11-11 DIAGNOSIS — Z48812 Encounter for surgical aftercare following surgery on the circulatory system: Secondary | ICD-10-CM | POA: Diagnosis not present

## 2023-11-11 DIAGNOSIS — I252 Old myocardial infarction: Secondary | ICD-10-CM | POA: Diagnosis present

## 2023-11-11 NOTE — Patient Instructions (Signed)
 Patient Instructions  Patient Details  Name: Jeremy Luna MRN: [id number] Date of Birth: 09-Sep-1955 Referring Provider:  Tobie Rowan, MD  Below are your personal goals for exercise, nutrition, and risk factors. Our goal is to help you stay on track towards obtaining and maintaining these goals. We will be discussing your progress on these goals with you throughout the program.  Initial Exercise Prescription:  Initial Exercise Prescription - 11/11/23 1100       Date of Initial Exercise RX and Referring Provider   Date 11/11/23    Referring Provider Tobie Rowan, MD      Oxygen   Maintain Oxygen Saturation 88% or higher      Treadmill   MPH 2.7    Grade 0    Minutes 15    METs 3.07      Recumbant Bike   Level 3    RPM 50    Watts 25    Minutes 15    METs 3.29      REL-XR   Level 2    Speed 50    Minutes 15    METs 3.29      T5 Nustep   Level 3    SPM 80    Minutes 15    METs 3.29      Track   Laps 38    Minutes 15    METs 3.07      Prescription Details   Frequency (times per week) 3    Duration Progress to 30 minutes of continuous aerobic without signs/symptoms of physical distress      Intensity   THRR 40-80% of Max Heartrate 93-131    Ratings of Perceived Exertion 11-13    Perceived Dyspnea 0-4      Progression   Progression Continue to progress workloads to maintain intensity without signs/symptoms of physical distress.      Resistance Training   Training Prescription Yes    Weight 5 lb    Reps 10-15          Exercise Goals: Frequency: Be able to perform aerobic exercise two to three times per week in program working toward 2-5 days per week of home exercise.  Intensity: Work with a perceived exertion of 11 (fairly light) - 15 (hard) while following your exercise prescription.  We will make changes to your prescription with you as you progress through the program.   Duration: Be able to do 30 to 45 minutes of continuous aerobic exercise  in addition to a 5 minute warm-up and a 5 minute cool-down routine.   Nutrition Goals: Your personal nutrition goals will be established when you do your nutrition analysis with the dietician.  The following are general nutrition guidelines to follow: Cholesterol < 200mg /day Sodium < 1500mg /day Fiber: Men over 50 yrs - 30 grams per day  Personal Goals:  Personal Goals and Risk Factors at Admission - 11/11/23 1155       Core Components/Risk Factors/Patient Goals on Admission    Weight Management Yes;Weight Maintenance    Intervention Weight Management: Develop a combined nutrition and exercise program designed to reach desired caloric intake, while maintaining appropriate intake of nutrient and fiber, sodium and fats, and appropriate energy expenditure required for the weight goal.;Weight Management: Provide education and appropriate resources to help participant work on and attain dietary goals.;Weight Management/Obesity: Establish reasonable short term and long term weight goals.    Admit Weight 185 lb 6.4 oz (84.1 kg)    Goal Weight:  Short Term 185 lb 6.4 oz (84.1 kg)    Goal Weight: Long Term 185 lb 6.4 oz (84.1 kg)    Expected Outcomes Short Term: Continue to assess and modify interventions until short term weight is achieved;Long Term: Adherence to nutrition and physical activity/exercise program aimed toward attainment of established weight goal;Weight Maintenance: Understanding of the daily nutrition guidelines, which includes 25-35% calories from fat, 7% or less cal from saturated fats, less than 200mg  cholesterol, less than 1.5gm of sodium, & 5 or more servings of fruits and vegetables daily;Understanding recommendations for meals to include 15-35% energy as protein, 25-35% energy from fat, 35-60% energy from carbohydrates, less than 200mg  of dietary cholesterol, 20-35 gm of total fiber daily;Understanding of distribution of calorie intake throughout the day with the consumption of 4-5  meals/snacks    Diabetes Yes    Intervention Provide education about signs/symptoms and action to take for hypo/hyperglycemia.;Provide education about proper nutrition, including hydration, and aerobic/resistive exercise prescription along with prescribed medications to achieve blood glucose in normal ranges: Fasting glucose 65-99 mg/dL    Expected Outcomes Short Term: Participant verbalizes understanding of the signs/symptoms and immediate care of hyper/hypoglycemia, proper foot care and importance of medication, aerobic/resistive exercise and nutrition plan for blood glucose control.;Long Term: Attainment of HbA1C < 7%.    Hypertension Yes    Intervention Provide education on lifestyle modifcations including regular physical activity/exercise, weight management, moderate sodium restriction and increased consumption of fresh fruit, vegetables, and low fat dairy, alcohol moderation, and smoking cessation.;Monitor prescription use compliance.    Expected Outcomes Long Term: Maintenance of blood pressure at goal levels.;Short Term: Continued assessment and intervention until BP is < 140/57mm HG in hypertensive participants. < 130/42mm HG in hypertensive participants with diabetes, heart failure or chronic kidney disease.    Lipids Yes    Intervention Provide education and support for participant on nutrition & aerobic/resistive exercise along with prescribed medications to achieve LDL 70mg , HDL >40mg .    Expected Outcomes Short Term: Participant states understanding of desired cholesterol values and is compliant with medications prescribed. Participant is following exercise prescription and nutrition guidelines.;Long Term: Cholesterol controlled with medications as prescribed, with individualized exercise RX and with personalized nutrition plan. Value goals: LDL < 70mg , HDL > 40 mg.          Tobacco Use Initial Evaluation: Social History   Tobacco Use  Smoking Status Never  Smokeless Tobacco Never     Exercise Goals and Review:  Exercise Goals     Row Name 11/11/23 1152             Exercise Goals   Increase Physical Activity Yes       Intervention Provide advice, education, support and counseling about physical activity/exercise needs.;Develop an individualized exercise prescription for aerobic and resistive training based on initial evaluation findings, risk stratification, comorbidities and participant's personal goals.       Expected Outcomes Short Term: Attend rehab on a regular basis to increase amount of physical activity.;Long Term: Add in home exercise to make exercise part of routine and to increase amount of physical activity.;Long Term: Exercising regularly at least 3-5 days a week.       Increase Strength and Stamina Yes       Intervention Provide advice, education, support and counseling about physical activity/exercise needs.;Develop an individualized exercise prescription for aerobic and resistive training based on initial evaluation findings, risk stratification, comorbidities and participant's personal goals.       Expected Outcomes Short  Term: Increase workloads from initial exercise prescription for resistance, speed, and METs.;Short Term: Perform resistance training exercises routinely during rehab and add in resistance training at home;Long Term: Improve cardiorespiratory fitness, muscular endurance and strength as measured by increased METs and functional capacity ( )       Able to understand and use rate of perceived exertion (RPE) scale Yes       Intervention Provide education and explanation on how to use RPE scale       Expected Outcomes Short Term: Able to use RPE daily in rehab to express subjective intensity level;Long Term:  Able to use RPE to guide intensity level when exercising independently       Able to understand and use Dyspnea scale Yes       Intervention Provide education and explanation on how to use Dyspnea scale       Expected Outcomes Short  Term: Able to use Dyspnea scale daily in rehab to express subjective sense of shortness of breath during exertion;Long Term: Able to use Dyspnea scale to guide intensity level when exercising independently       Knowledge and understanding of Target Heart Rate Range (THRR) Yes       Intervention Provide education and explanation of THRR including how the numbers were predicted and where they are located for reference       Expected Outcomes Short Term: Able to state/look up THRR;Short Term: Able to use daily as guideline for intensity in rehab;Long Term: Able to use THRR to govern intensity when exercising independently       Able to check pulse independently Yes       Intervention Provide education and demonstration on how to check pulse in carotid and radial arteries.;Review the importance of being able to check your own pulse for safety during independent exercise       Expected Outcomes Short Term: Able to explain why pulse checking is important during independent exercise;Long Term: Able to check pulse independently and accurately       Understanding of Exercise Prescription Yes       Intervention Provide education, explanation, and written materials on patient's individual exercise prescription       Expected Outcomes Short Term: Able to explain program exercise prescription;Long Term: Able to explain home exercise prescription to exercise independently

## 2023-11-11 NOTE — Progress Notes (Signed)
 Cardiac Individual Treatment Plan  Patient Details  Name: Jeremy Luna MRN: [id number] Date of Birth: October 17, 1955 Referring Provider:   Flowsheet Row Cardiac Rehab from 11/11/2023 in [facility] Cardiac and Pulmonary Rehab  Referring Provider Tobie Rowan, MD    Initial Encounter Date:  Flowsheet Row Cardiac Rehab from 11/11/2023 in [facility] Cardiac and Pulmonary Rehab  Date 11/11/23    Visit Diagnosis: NSTEMI (non-ST elevated myocardial infarction) [facility])  Status post coronary artery stent placement  Patient's Home Medications on Admission:  Current Outpatient Medications:    ALPRAZolam (XANAX) 0.5 MG tablet, Take 0.5 mg by mouth at bedtime., Disp: , Rfl:    amLODipine (NORVASC) 2.5 MG tablet, Take 2.5 mg by mouth daily., Disp: , Rfl:    aspirin EC 81 MG tablet, Take 81 mg by mouth daily., Disp: , Rfl:    atorvastatin  (LIPITOR) 80 MG tablet, Take 80 mg by mouth daily., Disp: , Rfl:    baclofen  (LIORESAL ) 10 MG tablet, Take 1 tablet (10 mg total) by mouth 3 (three) times daily as needed for muscle spasms., Disp: 30 each, Rfl: 0   cephALEXin  (KEFLEX ) 500 MG capsule, Take 2 capsules (1,000 mg total) by mouth 2 (two) times daily., Disp: 28 capsule, Rfl: 0   clopidogrel (PLAVIX) 75 MG tablet, Take 75 mg by mouth daily., Disp: , Rfl:    HYDROcodone-acetaminophen  (NORCO/VICODIN) 5-325 MG tablet, Take 1 tablet by mouth every 6 (six) hours as needed., Disp: , Rfl:    lisinopril  (PRINIVIL ,ZESTRIL ) 20 MG tablet, Take 20 mg by mouth daily. , Disp: , Rfl:    MAGNESIUM  OXIDE PO, Take 500 mg by mouth daily., Disp: , Rfl:    metFORMIN  (GLUCOPHAGE ) 500 MG tablet, Take 500-1,000 mg by mouth 2 (two) times daily with a meal. Take 1000 mg by mouth in the morning and take 500 mg by mouth in the evening., Disp: , Rfl:    metoprolol succinate (TOPROL-XL) 25 MG 24 hr tablet, Take 12.5 mg by mouth daily., Disp: , Rfl:    nitroGLYCERIN  (NITROSTAT ) 0.4 MG SL tablet, Place 0.4 mg under the tongue every 5 (five) minutes as  needed for chest pain., Disp: , Rfl:    omeprazole (PRILOSEC) 20 MG capsule, Take 20 mg by mouth daily. , Disp: , Rfl:    ondansetron  (ZOFRAN  ODT) 4 MG disintegrating tablet, Take 1 tablet (4 mg total) by mouth every 8 (eight) hours as needed for nausea or vomiting., Disp: 20 tablet, Rfl: 0   predniSONE (DELTASONE) 20 MG tablet, Take by mouth., Disp: , Rfl:    sertraline (ZOLOFT) 50 MG tablet, Take 1 tablet by mouth daily., Disp: , Rfl:   Past Medical History: Past Medical History:  Diagnosis Date   Benign essential hypertension 04/02/2016   CAD (coronary artery disease)    Carotid artery disease (HCC)    stent PCI 11/1999, antioplasty 2/02, persistent chest pain   Cholelithiasis    Coronary artery disease 12/15/2010   Overview:   Progressive angina - onset early 2001.   08/1999 Worsening angina, stent PCI of the RCA ([place]).   11/1999 Recurrent angina, stent PCI of the LAD ([place]).   03/2000 Persistent intermittent chest discomfort Relook catheterization revealing in-stent restenosis of the LAD.   04/2000 Redo angioplasty of the LAD.   Persistent intermittent chest pain.   09/11/2000 Relook catheterizat   Diabetes mellitus without complication (HCC)    Dyslipidemia    GERD (gastroesophageal reflux disease)    H/O angioplasty    Hypertension  Leg pain 07/27/2013   Overview:   12/2012 ABIs: R> 1.39, L. 1.40   Other dysphagia 06/22/2016   PONV (postoperative nausea and vomiting)    pt also says he is hard to wake up    Schatzki's ring    Symptomatic cholelithiasis    Tubular adenoma 04/02/2016   Overview:  6/132    Tobacco Use: Social History   Tobacco Use  Smoking Status Never  Smokeless Tobacco Never    Labs: Review Flowsheet       Latest Ref Rng & Units 05/07/2018  Labs for ITP Cardiac and Pulmonary Rehab  Hemoglobin A1c 4.8 - 5.6 % 6.5      Exercise Target Goals: Exercise Program Goal: Individual exercise prescription set using results from initial 6 min  walk test and THRR while considering  patient's activity barriers and safety.   Exercise Prescription Goal: Initial exercise prescription builds to 30-45 minutes a day of aerobic activity, 2-3 days per week.  Home exercise guidelines will be given to patient during program as part of exercise prescription that the participant will acknowledge.   Education: Aerobic Exercise: - Group verbal and visual presentation on the components of exercise prescription. Introduces F.I.T.T principle from ACSM for exercise prescriptions.  Reviews F.I.T.T. principles of aerobic exercise including progression. Written material provided at class time.   Education: Resistance Exercise: - Group verbal and visual presentation on the components of exercise prescription. Introduces F.I.T.T principle from ACSM for exercise prescriptions  Reviews F.I.T.T. principles of resistance exercise including progression. Written material provided at class time.    Education: Exercise & Equipment Safety: - Individual verbal instruction and demonstration of equipment use and safety with use of the equipment. Flowsheet Row Cardiac Rehab from 11/11/2023 in [facility] Cardiac and Pulmonary Rehab  Date 11/11/23  Educator MB  Instruction Review Code 1- Verbalizes Understanding    Education: Exercise Physiology & General Exercise Guidelines: - Group verbal and written instruction with models to review the exercise physiology of the cardiovascular system and associated critical values. Provides general exercise guidelines with specific guidelines to those with heart or lung disease. Written material provided at class time.   Education: Flexibility, Balance, Mind/Body Relaxation: - Group verbal and visual presentation with interactive activity on the components of exercise prescription. Introduces F.I.T.T principle from ACSM for exercise prescriptions. Reviews F.I.T.T. principles of flexibility and balance exercise training including  progression. Also discusses the mind body connection.  Reviews various relaxation techniques to help reduce and manage stress (i.e. Deep breathing, progressive muscle relaxation, and visualization). Balance handout provided to take home. Written material provided at class time.   Activity Barriers & Risk Stratification:  Activity Barriers & Cardiac Risk Stratification - 11/11/23 1148       Activity Barriers & Cardiac Risk Stratification   Activity Barriers Left Knee Replacement;Joint Problems;Back Problems;Neck/Spine Problems    Cardiac Risk Stratification Moderate          6 Minute Walk:  6 Minute Walk     Row Name 11/11/23 1147         6 Minute Walk   Phase Initial     Distance 1440 feet     Walk Time 6 minutes     # of Rest Breaks 0     MPH 2.73     METS 3.29     RPE 12     Perceived Dyspnea  1     VO2 Peak 11.53     Symptoms No     Resting  HR 55 bpm     Resting BP 108/62     Resting Oxygen Saturation  99 %     Exercise Oxygen Saturation  during 6 min walk 99 %     Max Ex. HR 90 bpm     Max Ex. BP 140/72     2 Minute Post BP 118/60        Oxygen Initial Assessment:   Oxygen Re-Evaluation:   Oxygen Discharge (Final Oxygen Re-Evaluation):   Initial Exercise Prescription:  Initial Exercise Prescription - 11/11/23 1100       Date of Initial Exercise RX and Referring Provider   Date 11/11/23    Referring Provider Tobie Rowan, MD      Oxygen   Maintain Oxygen Saturation 88% or higher      Treadmill   MPH 2.7    Grade 0    Minutes 15    METs 3.07      Recumbant Bike   Level 3    RPM 50    Watts 25    Minutes 15    METs 3.29      REL-XR   Level 2    Speed 50    Minutes 15    METs 3.29      T5 Nustep   Level 3    SPM 80    Minutes 15    METs 3.29      Track   Laps 38    Minutes 15    METs 3.07      Prescription Details   Frequency (times per week) 3    Duration Progress to 30 minutes of continuous aerobic without  signs/symptoms of physical distress      Intensity   THRR 40-80% of Max Heartrate 93-131    Ratings of Perceived Exertion 11-13    Perceived Dyspnea 0-4      Progression   Progression Continue to progress workloads to maintain intensity without signs/symptoms of physical distress.      Resistance Training   Training Prescription Yes    Weight 5 lb    Reps 10-15          Perform Capillary Blood Glucose checks as needed.  Exercise Prescription Changes:   Exercise Prescription Changes     Row Name 11/11/23 1100             Response to Exercise   Blood Pressure (Admit) 108/62       Blood Pressure (Exercise) 140/72       Blood Pressure (Exit) 118/60       Heart Rate (Admit) 55 bpm       Heart Rate (Exercise) 90 bpm       Heart Rate (Exit) 57 bpm       Oxygen Saturation (Admit) 99 %       Oxygen Saturation (Exercise) 99 %       Oxygen Saturation (Exit) 98 %       Rating of Perceived Exertion (Exercise) 12       Perceived Dyspnea (Exercise) 1       Symptoms none       Comments results         Progression   Average METs 3.29          Exercise Comments:   Exercise Goals and Review:   Exercise Goals     Row Name 11/11/23 1152             Exercise Goals  Increase Physical Activity Yes       Intervention Provide advice, education, support and counseling about physical activity/exercise needs.;Develop an individualized exercise prescription for aerobic and resistive training based on initial evaluation findings, risk stratification, comorbidities and participant's personal goals.       Expected Outcomes Short Term: Attend rehab on a regular basis to increase amount of physical activity.;Long Term: Add in home exercise to make exercise part of routine and to increase amount of physical activity.;Long Term: Exercising regularly at least 3-5 days a week.       Increase Strength and Stamina Yes       Intervention Provide advice, education, support and  counseling about physical activity/exercise needs.;Develop an individualized exercise prescription for aerobic and resistive training based on initial evaluation findings, risk stratification, comorbidities and participant's personal goals.       Expected Outcomes Short Term: Increase workloads from initial exercise prescription for resistance, speed, and METs.;Short Term: Perform resistance training exercises routinely during rehab and add in resistance training at home;Long Term: Improve cardiorespiratory fitness, muscular endurance and strength as measured by increased METs and functional capacity ( )       Able to understand and use rate of perceived exertion (RPE) scale Yes       Intervention Provide education and explanation on how to use RPE scale       Expected Outcomes Short Term: Able to use RPE daily in rehab to express subjective intensity level;Long Term:  Able to use RPE to guide intensity level when exercising independently       Able to understand and use Dyspnea scale Yes       Intervention Provide education and explanation on how to use Dyspnea scale       Expected Outcomes Short Term: Able to use Dyspnea scale daily in rehab to express subjective sense of shortness of breath during exertion;Long Term: Able to use Dyspnea scale to guide intensity level when exercising independently       Knowledge and understanding of Target Heart Rate Range (THRR) Yes       Intervention Provide education and explanation of THRR including how the numbers were predicted and where they are located for reference       Expected Outcomes Short Term: Able to state/look up THRR;Short Term: Able to use daily as guideline for intensity in rehab;Long Term: Able to use THRR to govern intensity when exercising independently       Able to check pulse independently Yes       Intervention Provide education and demonstration on how to check pulse in carotid and radial arteries.;Review the importance of being able to  check your own pulse for safety during independent exercise       Expected Outcomes Short Term: Able to explain why pulse checking is important during independent exercise;Long Term: Able to check pulse independently and accurately       Understanding of Exercise Prescription Yes       Intervention Provide education, explanation, and written materials on patient's individual exercise prescription       Expected Outcomes Short Term: Able to explain program exercise prescription;Long Term: Able to explain home exercise prescription to exercise independently          Exercise Goals Re-Evaluation :   Discharge Exercise Prescription (Final Exercise Prescription Changes):  Exercise Prescription Changes - 11/11/23 1100       Response to Exercise   Blood Pressure (Admit) 108/62    Blood Pressure (Exercise) 140/72  Blood Pressure (Exit) 118/60    Heart Rate (Admit) 55 bpm    Heart Rate (Exercise) 90 bpm    Heart Rate (Exit) 57 bpm    Oxygen Saturation (Admit) 99 %    Oxygen Saturation (Exercise) 99 %    Oxygen Saturation (Exit) 98 %    Rating of Perceived Exertion (Exercise) 12    Perceived Dyspnea (Exercise) 1    Symptoms none    Comments results      Progression   Average METs 3.29          Nutrition:  Target Goals: Understanding of nutrition guidelines, daily intake of sodium 1500mg , cholesterol 200mg , calories 30% from fat and 7% or less from saturated fats, daily to have 5 or more servings of fruits and vegetables.  Education: Nutrition 1 -Group instruction provided by verbal, written material, interactive activities, discussions, models, and posters to present general guidelines for heart healthy nutrition including macronutrients, label reading, and promoting whole foods over processed counterparts. Education serves as Pensions consultant of discussion of heart healthy eating for all. Written material provided at class time.    Education: Nutrition 2 -Group instruction  provided by verbal, written material, interactive activities, discussions, models, and posters to present general guidelines for heart healthy nutrition including sodium, cholesterol, and saturated fat. Providing guidance of habit forming to improve blood pressure, cholesterol, and body weight. Written material provided at class time.     Biometrics:  Pre Biometrics - 11/11/23 1152       Pre Biometrics   Height 5' 11.26 (1.81 m)    Weight 185 lb 6.4 oz (84.1 kg)    Waist Circumference 37.8 inches    Hip Circumference 40.5 inches    Waist to Hip Ratio 0.93 %    BMI (Calculated) 25.67    Single Leg Stand 30 seconds           Nutrition Therapy Plan and Nutrition Goals:  Nutrition Therapy & Goals - 11/11/23 1154       Personal Nutrition Goals   Nutrition Goal Will meet with RD on 9/22      Intervention Plan   Intervention Prescribe, educate and counsel regarding individualized specific dietary modifications aiming towards targeted core components such as weight, hypertension, lipid management, diabetes, heart failure and other comorbidities.    Expected Outcomes Short Term Goal: Understand basic principles of dietary content, such as calories, fat, sodium, cholesterol and nutrients.          Nutrition Assessments:  MEDIFICTS Score Key: >=70 Need to make dietary changes  40-70 Heart Healthy Diet <= 40 Therapeutic Level Cholesterol Diet  Flowsheet Row Cardiac Rehab from 11/11/2023 in [facility] Cardiac and Pulmonary Rehab  Picture Your Plate Total Score on Admission 51   Picture Your Plate Scores: <59 Unhealthy dietary pattern with much room for improvement. 41-50 Dietary pattern unlikely to meet recommendations for good health and room for improvement. 51-60 More healthful dietary pattern, with some room for improvement.  >60 Healthy dietary pattern, although there may be some specific behaviors that could be improved.    Nutrition Goals Re-Evaluation:   Nutrition Goals  Discharge (Final Nutrition Goals Re-Evaluation):   Psychosocial: Target Goals: Acknowledge presence or absence of significant depression and/or stress, maximize coping skills, provide positive support system. Participant is able to verbalize types and ability to use techniques and skills needed for reducing stress and depression.   Education: Stress, Anxiety, and Depression - Group verbal and visual presentation to define topics covered.  Reviews how body is impacted by stress, anxiety, and depression.  Also discusses healthy ways to reduce stress and to treat/manage anxiety and depression. Written material provided at class time.   Education: Sleep Hygiene -Provides group verbal and written instruction about how sleep can affect your health.  Define sleep hygiene, discuss sleep cycles and impact of sleep habits. Review good sleep hygiene tips.   Initial Review & Psychosocial Screening:  Initial Psych Review & Screening - 11/05/23 1427       Initial Review   Current issues with None Identified      Family Dynamics   Good Support System? Yes      Barriers   Psychosocial barriers to participate in program There are no identifiable barriers or psychosocial needs.      Screening Interventions   Interventions Encouraged to exercise;Provide feedback about the scores to participant    Expected Outcomes Short Term goal: Utilizing psychosocial counselor, staff and physician to assist with identification of specific Stressors or current issues interfering with healing process. Setting desired goal for each stressor or current issue identified.;Long Term Goal: Stressors or current issues are controlled or eliminated.;Long Term goal: The participant improves quality of Life and PHQ9 Scores as seen by post scores and/or verbalization of changes;Short Term goal: Identification and review with participant of any Quality of Life or Depression concerns found by scoring the questionnaire.           Quality of Life Scores:   Quality of Life - 11/11/23 1153       Quality of Life   Select Quality of Life      Quality of Life Scores   Health/Function Pre 23.3 %    Socioeconomic Pre 22.93 %    Psych/Spiritual Pre 22.93 %    Family Pre 25.8 %    GLOBAL Pre 23.51 %         Scores of 19 and below usually indicate a poorer quality of life in these areas.  A difference of  2-3 points is a clinically meaningful difference.  A difference of 2-3 points in the total score of the Quality of Life Index has been associated with significant improvement in overall quality of life, self-image, physical symptoms, and general health in studies assessing change in quality of life.  PHQ-9: Review Flowsheet       11/11/2023  Depression screen PHQ 2/9  Decreased Interest 0  Down, Depressed, Hopeless 0  PHQ - 2 Score 0  Altered sleeping 0  Tired, decreased energy 0  Change in appetite 0  Feeling bad or failure about yourself  0  Trouble concentrating 0  Moving slowly or fidgety/restless 0  Suicidal thoughts 0  PHQ-9 Score 0  Difficult doing work/chores Not difficult at all   Interpretation of Total Score  Total Score Depression Severity:  1-4 = Minimal depression, 5-9 = Mild depression, 10-14 = Moderate depression, 15-19 = Moderately severe depression, 20-27 = Severe depression   Psychosocial Evaluation and Intervention:  Psychosocial Evaluation - 11/05/23 1427       Psychosocial Evaluation & Interventions   Interventions Encouraged to exercise with the program and follow exercise prescription    Comments Mr. Kaufhold is coming to cardiac rehab after stent placement. He states he doesn't feel like he has any more stress than anyone else out there, so he just takes it day by day. He has been walking more which helps boost his energy. He retired a little over a year ago and mentions  that has taken time to get used to. He is curious to try the program and is ready to start.    Expected  Outcomes Short: attend cardiac rehab for education and exercise Long: Develop and maintain positive self care habits    Continue Psychosocial Services  Follow up required by staff          Psychosocial Re-Evaluation:   Psychosocial Discharge (Final Psychosocial Re-Evaluation):   Vocational Rehabilitation: Provide vocational rehab assistance to qualifying candidates.   Vocational Rehab Evaluation & Intervention:  Vocational Rehab - 11/05/23 1417       Initial Vocational Rehab Evaluation & Intervention   Assessment shows need for Vocational Rehabilitation No          Education: Education Goals: Education classes will be provided on a variety of topics geared toward better understanding of heart health and risk factor modification. Participant will state understanding/return demonstration of topics presented as noted by education test scores.  Learning Barriers/Preferences:  Learning Barriers/Preferences - 11/05/23 1405       Learning Barriers/Preferences   Learning Barriers None    Learning Preferences None          General Cardiac Education Topics:  AED/CPR: - Group verbal and written instruction with the use of models to demonstrate the basic use of the AED with the basic ABC's of resuscitation.   Test and Procedures: - Group verbal and visual presentation and models provide information about basic cardiac anatomy and function. Reviews the testing methods done to diagnose heart disease and the outcomes of the test results. Describes the treatment choices: Medical Management, Angioplasty, or Coronary Bypass Surgery for treating various heart conditions including Myocardial Infarction, Angina, Valve Disease, and Cardiac Arrhythmias. Written material provided at class time.   Medication Safety: - Group verbal and visual instruction to review commonly prescribed medications for heart and lung disease. Reviews the medication, class of the drug, and side effects.  Includes the steps to properly store meds and maintain the prescription regimen. Written material provided at class time.   Intimacy: - Group verbal instruction through game format to discuss how heart and lung disease can affect sexual intimacy. Written material provided at class time.   Know Your Numbers and Heart Failure: - Group verbal and visual instruction to discuss disease risk factors for cardiac and pulmonary disease and treatment options.  Reviews associated critical values for Overweight/Obesity, Hypertension, Cholesterol, and Diabetes.  Discusses basics of heart failure: signs/symptoms and treatments.  Introduces Heart Failure Zone chart for action plan for heart failure. Written material provided at class time.   Infection Prevention: - Provides verbal and written material to individual with discussion of infection control including proper hand washing and proper equipment cleaning during exercise session. Flowsheet Row Cardiac Rehab from 11/11/2023 in [facility] Cardiac and Pulmonary Rehab  Date 11/11/23  Educator MB  Instruction Review Code 1- Verbalizes Understanding    Falls Prevention: - Provides verbal and written material to individual with discussion of falls prevention and safety. Flowsheet Row Cardiac Rehab from 11/11/2023 in [facility] Cardiac and Pulmonary Rehab  Date 11/11/23  Educator MB  Instruction Review Code 1- Verbalizes Understanding    Other: -Provides group and verbal instruction on various topics (see comments)   Knowledge Questionnaire Score:  Knowledge Questionnaire Score - 11/11/23 1155       Knowledge Questionnaire Score   Pre Score 26/26          Core Components/Risk Factors/Patient Goals at Admission:  Personal Goals and Risk Factors  at Admission - 11/11/23 1155       Core Components/Risk Factors/Patient Goals on Admission    Weight Management Yes;Weight Maintenance    Intervention Weight Management: Develop a combined nutrition and exercise  program designed to reach desired caloric intake, while maintaining appropriate intake of nutrient and fiber, sodium and fats, and appropriate energy expenditure required for the weight goal.;Weight Management: Provide education and appropriate resources to help participant work on and attain dietary goals.;Weight Management/Obesity: Establish reasonable short term and long term weight goals.    Admit Weight 185 lb 6.4 oz (84.1 kg)    Goal Weight: Short Term 185 lb 6.4 oz (84.1 kg)    Goal Weight: Long Term 185 lb 6.4 oz (84.1 kg)    Expected Outcomes Short Term: Continue to assess and modify interventions until short term weight is achieved;Long Term: Adherence to nutrition and physical activity/exercise program aimed toward attainment of established weight goal;Weight Maintenance: Understanding of the daily nutrition guidelines, which includes 25-35% calories from fat, 7% or less cal from saturated fats, less than 200mg  cholesterol, less than 1.5gm of sodium, & 5 or more servings of fruits and vegetables daily;Understanding recommendations for meals to include 15-35% energy as protein, 25-35% energy from fat, 35-60% energy from carbohydrates, less than 200mg  of dietary cholesterol, 20-35 gm of total fiber daily;Understanding of distribution of calorie intake throughout the day with the consumption of 4-5 meals/snacks    Diabetes Yes    Intervention Provide education about signs/symptoms and action to take for hypo/hyperglycemia.;Provide education about proper nutrition, including hydration, and aerobic/resistive exercise prescription along with prescribed medications to achieve blood glucose in normal ranges: Fasting glucose 65-99 mg/dL    Expected Outcomes Short Term: Participant verbalizes understanding of the signs/symptoms and immediate care of hyper/hypoglycemia, proper foot care and importance of medication, aerobic/resistive exercise and nutrition plan for blood glucose control.;Long Term: Attainment  of HbA1C < 7%.    Hypertension Yes    Intervention Provide education on lifestyle modifcations including regular physical activity/exercise, weight management, moderate sodium restriction and increased consumption of fresh fruit, vegetables, and low fat dairy, alcohol moderation, and smoking cessation.;Monitor prescription use compliance.    Expected Outcomes Long Term: Maintenance of blood pressure at goal levels.;Short Term: Continued assessment and intervention until BP is < 140/46mm HG in hypertensive participants. < 130/42mm HG in hypertensive participants with diabetes, heart failure or chronic kidney disease.    Lipids Yes    Intervention Provide education and support for participant on nutrition & aerobic/resistive exercise along with prescribed medications to achieve LDL 70mg , HDL >40mg .    Expected Outcomes Short Term: Participant states understanding of desired cholesterol values and is compliant with medications prescribed. Participant is following exercise prescription and nutrition guidelines.;Long Term: Cholesterol controlled with medications as prescribed, with individualized exercise RX and with personalized nutrition plan. Value goals: LDL < 70mg , HDL > 40 mg.          Education:Diabetes - Individual verbal and written instruction to review signs/symptoms of diabetes, desired ranges of glucose level fasting, after meals and with exercise. Acknowledge that pre and post exercise glucose checks will be done for 3 sessions at entry of program. Flowsheet Row Cardiac Rehab from 11/11/2023 in [facility] Cardiac and Pulmonary Rehab  Date 11/11/23  Educator MB  Instruction Review Code 1- Verbalizes Understanding    Core Components/Risk Factors/Patient Goals Review:    Core Components/Risk Factors/Patient Goals at Discharge (Final Review):    ITP Comments:  ITP Comments  Row Name 11/05/23 1414 11/11/23 1147         ITP Comments Initial phone call completed. Diagnosis can be found  in [facility] 7/5. EP Orientation scheduled for Monday 9/8 10am. Completed and gym orientation for cardiac rehab. Initial ITP created and sent for review to Dr. Oneil Pinal, Medical Director.         Comments: Initial ITP

## 2023-11-13 ENCOUNTER — Encounter: Admitting: Emergency Medicine

## 2023-11-13 DIAGNOSIS — I252 Old myocardial infarction: Secondary | ICD-10-CM | POA: Diagnosis not present

## 2023-11-13 DIAGNOSIS — Z955 Presence of coronary angioplasty implant and graft: Secondary | ICD-10-CM

## 2023-11-13 DIAGNOSIS — I214 Non-ST elevation (NSTEMI) myocardial infarction: Secondary | ICD-10-CM

## 2023-11-13 LAB — GLUCOSE, CAPILLARY
Glucose-Capillary: 172 mg/dL — ABNORMAL HIGH (ref 70–99)
Glucose-Capillary: 97 mg/dL (ref 70–99)

## 2023-11-13 NOTE — Progress Notes (Signed)
 Daily Session Note  Patient Details  Name: Jeremy Luna MRN: [id number] Date of Birth: 07-16-1955 Referring Provider:   Flowsheet Row Cardiac Rehab from 11/11/2023 in [facility] Cardiac and Pulmonary Rehab  Referring Provider Tobie Rowan, MD    Encounter Date: 11/13/2023  Check In:  Session Check In - 11/13/23 1113       Check-In   Supervising physician immediately available to respond to emergencies See telemetry face sheet for immediately available ER MD    Location [facility]-Cardiac & Pulmonary Rehab    Staff Present Rollene Paterson, MS, Exercise Physiologist;Maxon Conetta BS, Exercise Physiologist;Joseph Rolinda RCP,RRT,BSRT;Wheeler Incorvaia RN,BSN    Virtual Visit No    Medication changes reported     No    Fall or balance concerns reported    No    Warm-up and Cool-down Performed on first and last piece of equipment    Resistance Training Performed Yes    VAD Patient? No    PAD/SET Patient? No      Pain Assessment   Currently in Pain? No/denies             Social History   Tobacco Use  Smoking Status Never  Smokeless Tobacco Never    Goals Met:  Independence with exercise equipment Exercise tolerated well No report of concerns or symptoms today Strength training completed today  Goals Unmet:  Not Applicable  Comments: First full day of exercise!  Patient was oriented to gym and equipment including functions, settings, policies, and procedures.  Patient's individual exercise prescription and treatment plan were reviewed.  All starting workloads were established based on the results of the 6 minute walk test done at initial orientation visit.  The plan for exercise progression was also introduced and progression will be customized based on patient's performance and goals.    Dr. Oneil Pinal is Medical Director for [facility] Cardiac Rehabilitation.  Dr. Fuad Aleskerov is Medical Director for [facility] Pulmonary Rehabilitation.

## 2023-11-15 ENCOUNTER — Encounter

## 2023-11-15 DIAGNOSIS — Z955 Presence of coronary angioplasty implant and graft: Secondary | ICD-10-CM

## 2023-11-15 DIAGNOSIS — I252 Old myocardial infarction: Secondary | ICD-10-CM | POA: Diagnosis not present

## 2023-11-15 DIAGNOSIS — I214 Non-ST elevation (NSTEMI) myocardial infarction: Secondary | ICD-10-CM

## 2023-11-15 LAB — GLUCOSE, CAPILLARY
Glucose-Capillary: 160 mg/dL — ABNORMAL HIGH (ref 70–99)
Glucose-Capillary: 111 mg/dL — ABNORMAL HIGH (ref 70–99)

## 2023-11-15 NOTE — Progress Notes (Signed)
 Daily Session Note  Patient Details  Name: Jeremy Luna MRN: [id number] Date of Birth: [birth year] Referring Provider:   Flowsheet Row Cardiac Rehab from 11/11/2023 in [facility] Cardiac and Pulmonary Rehab  Referring Provider Tobie Rowan, MD    Encounter Date: 11/15/2023  Check In:  Session Check In - 11/15/23 1053       Check-In   Supervising physician immediately available to respond to emergencies See telemetry face sheet for immediately available ER MD    Location [facility]-Cardiac & Pulmonary Rehab    Staff Present Burnard Davenport RN,BSN,MPA;Joseph Hood RCP,RRT,BSRT;Maxon Conetta BS, Exercise Physiologist;Noah Tickle, BS, Exercise Physiologist    Virtual Visit No    Medication changes reported     No    Fall or balance concerns reported    No    Warm-up and Cool-down Performed on first and last piece of equipment    Resistance Training Performed Yes    VAD Patient? No    PAD/SET Patient? No      Pain Assessment   Currently in Pain? No/denies             Social History   Tobacco Use  Smoking Status Never  Smokeless Tobacco Never    Goals Met:  Independence with exercise equipment Exercise tolerated well No report of concerns or symptoms today Strength training completed today  Goals Unmet:  Not Applicable  Comments: Pt able to follow exercise prescription today without complaint.  Will continue to monitor for progression.    Dr. Oneil Pinal is Medical Director for [facility] Cardiac Rehabilitation.  Dr. Fuad Aleskerov is Medical Director for [facility] Pulmonary Rehabilitation.

## 2023-11-18 ENCOUNTER — Encounter

## 2023-11-18 DIAGNOSIS — I214 Non-ST elevation (NSTEMI) myocardial infarction: Secondary | ICD-10-CM

## 2023-11-18 DIAGNOSIS — I252 Old myocardial infarction: Secondary | ICD-10-CM | POA: Diagnosis not present

## 2023-11-18 DIAGNOSIS — Z955 Presence of coronary angioplasty implant and graft: Secondary | ICD-10-CM

## 2023-11-18 LAB — GLUCOSE, CAPILLARY
Glucose-Capillary: 173 mg/dL — ABNORMAL HIGH (ref 70–99)
Glucose-Capillary: 128 mg/dL — ABNORMAL HIGH (ref 70–99)

## 2023-11-18 NOTE — Progress Notes (Signed)
 Daily Session Note  Patient Details  Name: Jeremy Luna MRN: [id number] Date of Birth: 1955/06/26 Referring Provider:   Flowsheet Row Cardiac Rehab from 11/11/2023 in [facility] Cardiac and Pulmonary Rehab  Referring Provider Tobie Rowan, MD    Encounter Date: 11/18/2023  Check In:  Session Check In - 11/18/23 1113       Check-In   Supervising physician immediately available to respond to emergencies See telemetry face sheet for immediately available ER MD    Location [facility]-Cardiac & Pulmonary Rehab    Staff Present Burnard Davenport RN,BSN,MPA;Joseph Rolinda RCP,RRT,BSRT;Laura Cates RN,BSN;Chanda Laperle Dyane BS, ACSM CEP, Exercise Physiologist    Virtual Visit No    Medication changes reported     No    Fall or balance concerns reported    No    Warm-up and Cool-down Performed on first and last piece of equipment    Resistance Training Performed Yes    VAD Patient? No    PAD/SET Patient? No      Pain Assessment   Currently in Pain? No/denies             Social History   Tobacco Use  Smoking Status Never  Smokeless Tobacco Never    Goals Met:  Independence with exercise equipment Exercise tolerated well No report of concerns or symptoms today Strength training completed today  Goals Unmet:  Not Applicable  Comments: Pt able to follow exercise prescription today without complaint.  Will continue to monitor for progression.    Dr. Oneil Pinal is Medical Director for [facility] Cardiac Rehabilitation.  Dr. Fuad Aleskerov is Medical Director for [facility] Pulmonary Rehabilitation.

## 2023-11-20 ENCOUNTER — Encounter

## 2023-11-20 DIAGNOSIS — I214 Non-ST elevation (NSTEMI) myocardial infarction: Secondary | ICD-10-CM

## 2023-11-20 DIAGNOSIS — I252 Old myocardial infarction: Secondary | ICD-10-CM | POA: Diagnosis not present

## 2023-11-20 DIAGNOSIS — Z955 Presence of coronary angioplasty implant and graft: Secondary | ICD-10-CM

## 2023-11-20 NOTE — Progress Notes (Signed)
 Daily Session Note  Patient Details  Name: Jeremy Luna MRN: [id number] Date of Birth: 1955/10/04 Referring Provider:   Flowsheet Row Cardiac Rehab from 11/11/2023 in [facility] Cardiac and Pulmonary Rehab  Referring Provider Tobie Rowan, MD    Encounter Date: 11/20/2023  Check In:  Session Check In - 11/20/23 1018       Check-In   Supervising physician immediately available to respond to emergencies See telemetry face sheet for immediately available ER MD    Location [facility]-Cardiac & Pulmonary Rehab    Staff Present Burnard Davenport RN,BSN,MPA;Joseph [facility] RCP,RRT,BSRT;Margaret Best, MS, Exercise Physiologist;Jason Elnor RDN,LDN    Virtual Visit No    Medication changes reported     No    Fall or balance concerns reported    No    Warm-up and Cool-down Performed on first and last piece of equipment    Resistance Training Performed Yes    VAD Patient? No    PAD/SET Patient? No      Pain Assessment   Currently in Pain? No/denies             Social History   Tobacco Use  Smoking Status Never  Smokeless Tobacco Never    Goals Met:  Independence with exercise equipment Exercise tolerated well No report of concerns or symptoms today Strength training completed today  Goals Unmet:  Not Applicable  Comments: Pt able to follow exercise prescription today without complaint.  Will continue to monitor for progression.    Dr. Oneil Pinal is Medical Director for [facility] Cardiac Rehabilitation.  Dr. Fuad Aleskerov is Medical Director for [facility] Pulmonary Rehabilitation.

## 2023-11-22 ENCOUNTER — Encounter: Admitting: Emergency Medicine

## 2023-11-22 DIAGNOSIS — Z955 Presence of coronary angioplasty implant and graft: Secondary | ICD-10-CM

## 2023-11-22 DIAGNOSIS — I252 Old myocardial infarction: Secondary | ICD-10-CM | POA: Diagnosis not present

## 2023-11-22 DIAGNOSIS — I214 Non-ST elevation (NSTEMI) myocardial infarction: Secondary | ICD-10-CM

## 2023-11-22 NOTE — Progress Notes (Signed)
 Daily Session Note  Patient Details  Name: Jeremy Luna MRN: [id number] Date of Birth: Aug 08, 1955 Referring Provider:   Flowsheet Row Cardiac Rehab from 11/11/2023 in [facility] Cardiac and Pulmonary Rehab  Referring Provider Tobie Rowan, MD    Encounter Date: 11/22/2023  Check In:  Session Check In - 11/22/23 1107       Check-In   Supervising physician immediately available to respond to emergencies See telemetry face sheet for immediately available ER MD    Location [facility]-Cardiac & Pulmonary Rehab    Staff Present Rollene Paterson, MS, Exercise Physiologist;Noah Tickle, BS, Exercise Physiologist;Mivaan Corbitt RN,BSN;Joseph Rolinda RCP,RRT,BSRT    Virtual Visit No    Medication changes reported     No    Fall or balance concerns reported    No    Warm-up and Cool-down Performed on first and last piece of equipment    Resistance Training Performed Yes    VAD Patient? No      Pain Assessment   Currently in Pain? No/denies             Social History   Tobacco Use  Smoking Status Never  Smokeless Tobacco Never    Goals Met:  Independence with exercise equipment Exercise tolerated well No report of concerns or symptoms today Strength training completed today  Goals Unmet:  Not Applicable  Comments: Pt able to follow exercise prescription today without complaint.  Will continue to monitor for progression.    Dr. Oneil Pinal is Medical Director for [facility] Cardiac Rehabilitation.  Dr. Fuad Aleskerov is Medical Director for [facility] Pulmonary Rehabilitation.

## 2023-11-25 ENCOUNTER — Encounter

## 2023-11-25 DIAGNOSIS — Z955 Presence of coronary angioplasty implant and graft: Secondary | ICD-10-CM

## 2023-11-25 DIAGNOSIS — I214 Non-ST elevation (NSTEMI) myocardial infarction: Secondary | ICD-10-CM

## 2023-11-25 DIAGNOSIS — I252 Old myocardial infarction: Secondary | ICD-10-CM | POA: Diagnosis not present

## 2023-11-25 NOTE — Progress Notes (Signed)
 Daily Session Note  Patient Details  Name: Jeremy Luna MRN: [id number] Date of Birth: 19-Oct-1955 Referring Provider:   Flowsheet Row Cardiac Rehab from 11/11/2023 in [facility] Cardiac and Pulmonary Rehab  Referring Provider Tobie Rowan, MD    Encounter Date: 11/25/2023  Check In:  Session Check In - 11/25/23 1016       Check-In   Supervising physician immediately available to respond to emergencies See telemetry face sheet for immediately available ER MD    Location [facility]-Cardiac & Pulmonary Rehab    Staff Present Burnard Davenport RN,BSN,MPA;Joseph [facility] Dyane BS, ACSM CEP, Exercise Physiologist;Jason Elnor RDN,LDN    Virtual Visit No    Medication changes reported     No    Fall or balance concerns reported    No    Warm-up and Cool-down Performed on first and last piece of equipment    Resistance Training Performed Yes    VAD Patient? No    PAD/SET Patient? No      Pain Assessment   Currently in Pain? No/denies             Social History   Tobacco Use  Smoking Status Never  Smokeless Tobacco Never    Goals Met:  Independence with exercise equipment Exercise tolerated well No report of concerns or symptoms today Strength training completed today  Goals Unmet:  Not Applicable  Comments: Pt able to follow exercise prescription today without complaint.  Will continue to monitor for progression.    Dr. Oneil Pinal is Medical Director for [facility] Cardiac Rehabilitation.  Dr. Fuad Aleskerov is Medical Director for [facility] Pulmonary Rehabilitation.

## 2023-11-25 NOTE — Progress Notes (Signed)
 Assessment start time: 9:59 AM  Digestive issues/concerns: no known food allergies   24-hours Recall: B: sausage, scrambled eggs sometimes has piece of toast L: chicken salad sandwich  D: meat, potatoes or pintos  Beverages decaf coffee, water (64oz) Alcohol beer 2 times per week  Education r/t nutrition plan Patient drinking ~64oz of water daily. He is not drinking any caffeine anymore. He eats 2-3 meals per day. Reviewed his food recall. Educated on saturated fat and sodium in many of the foods he is eating. He has made a lot of changes from fast food to more home cooked meals. Provided mediterranean diet handout. Educated on types of fats, sources, and how to read fact labels. Brainstormed several meals and snacks with foods he likes and will eat focusing on reducing saturated fat or sodium.     Goal 1: Read labels and reduce sodium intake to below 2300mg . Ideally 1500mg  per day. Goal 2: Reduce saturated fat, less than 12g per day. Replace bad fats for more heart healthy fats. Goal 3: Include more colorful produce, aim for 5-8 servings of fruits and veggies per day  End time 10:32 AM

## 2023-11-27 ENCOUNTER — Encounter

## 2023-11-27 DIAGNOSIS — Z955 Presence of coronary angioplasty implant and graft: Secondary | ICD-10-CM

## 2023-11-27 DIAGNOSIS — I214 Non-ST elevation (NSTEMI) myocardial infarction: Secondary | ICD-10-CM

## 2023-11-27 DIAGNOSIS — I252 Old myocardial infarction: Secondary | ICD-10-CM | POA: Diagnosis not present

## 2023-11-27 NOTE — Progress Notes (Signed)
 Cardiac Individual Treatment Plan  Patient Details  Name: Jeremy Luna MRN: [id number] Date of Birth: 03/19/1955 Referring Provider:   Flowsheet Row Cardiac Rehab from 11/11/2023 in [facility] Cardiac and Pulmonary Rehab  Referring Provider Tobie Rowan, MD    Initial Encounter Date:  Flowsheet Row Cardiac Rehab from 11/11/2023 in [facility] Cardiac and Pulmonary Rehab  Date 11/11/23    Visit Diagnosis: NSTEMI (non-ST elevated myocardial infarction) [facility])  Status post coronary artery stent placement  Patient's Home Medications on Admission:  Current Outpatient Medications:    ALPRAZolam (XANAX) 0.5 MG tablet, Take 0.5 mg by mouth at bedtime., Disp: , Rfl:    amLODipine (NORVASC) 2.5 MG tablet, Take 2.5 mg by mouth daily., Disp: , Rfl:    aspirin EC 81 MG tablet, Take 81 mg by mouth daily., Disp: , Rfl:    atorvastatin  (LIPITOR) 80 MG tablet, Take 80 mg by mouth daily., Disp: , Rfl:    baclofen  (LIORESAL ) 10 MG tablet, Take 1 tablet (10 mg total) by mouth 3 (three) times daily as needed for muscle spasms., Disp: 30 each, Rfl: 0   cephALEXin  (KEFLEX ) 500 MG capsule, Take 2 capsules (1,000 mg total) by mouth 2 (two) times daily., Disp: 28 capsule, Rfl: 0   clopidogrel (PLAVIX) 75 MG tablet, Take 75 mg by mouth daily., Disp: , Rfl:    HYDROcodone-acetaminophen  (NORCO/VICODIN) 5-325 MG tablet, Take 1 tablet by mouth every 6 (six) hours as needed., Disp: , Rfl:    lisinopril  (PRINIVIL ,ZESTRIL ) 20 MG tablet, Take 20 mg by mouth daily. , Disp: , Rfl:    MAGNESIUM  OXIDE PO, Take 500 mg by mouth daily., Disp: , Rfl:    metFORMIN  (GLUCOPHAGE ) 500 MG tablet, Take 500-1,000 mg by mouth 2 (two) times daily with a meal. Take 1000 mg by mouth in the morning and take 500 mg by mouth in the evening., Disp: , Rfl:    metoprolol succinate (TOPROL-XL) 25 MG 24 hr tablet, Take 12.5 mg by mouth daily., Disp: , Rfl:    nitroGLYCERIN  (NITROSTAT ) 0.4 MG SL tablet, Place 0.4 mg under the tongue every 5 (five) minutes as  needed for chest pain., Disp: , Rfl:    omeprazole (PRILOSEC) 20 MG capsule, Take 20 mg by mouth daily. , Disp: , Rfl:    ondansetron  (ZOFRAN  ODT) 4 MG disintegrating tablet, Take 1 tablet (4 mg total) by mouth every 8 (eight) hours as needed for nausea or vomiting., Disp: 20 tablet, Rfl: 0   predniSONE (DELTASONE) 20 MG tablet, Take by mouth., Disp: , Rfl:    sertraline (ZOLOFT) 50 MG tablet, Take 1 tablet by mouth daily., Disp: , Rfl:   Past Medical History: Past Medical History:  Diagnosis Date   Benign essential hypertension 04/02/2016   CAD (coronary artery disease)    Carotid artery disease    stent PCI 11/1999, antioplasty 2/02, persistent chest pain   Cholelithiasis    Coronary artery disease 12/15/2010   Overview:   Progressive angina - onset early 2001.   08/1999 Worsening angina, stent PCI of the RCA ([place]).   11/1999 Recurrent angina, stent PCI of the LAD ([place]   03/2000 Persistent intermittent chest discomfort Relook catheterization revealing in-stent restenosis of the LAD.   04/2000 Redo angioplasty of the LAD.   Persistent intermittent chest pain.   09/11/2000 Relook catheterizat   Diabetes mellitus without complication (HCC)    Dyslipidemia    GERD (gastroesophageal reflux disease)    H/O angioplasty    Hypertension  Leg pain 07/27/2013   Overview:   12/2012 ABIs: R> 1.39, L. 1.40   Other dysphagia 06/22/2016   PONV (postoperative nausea and vomiting)    pt also says he is hard to wake up    Schatzki's ring    Symptomatic cholelithiasis    Tubular adenoma 04/02/2016   Overview:  6/132    Tobacco Use: Social History   Tobacco Use  Smoking Status Never  Smokeless Tobacco Never    Labs: Review Flowsheet       Latest Ref Rng & Units 05/07/2018  Labs for ITP Cardiac and Pulmonary Rehab  Hemoglobin A1c 4.8 - 5.6 % 6.5      Exercise Target Goals: Exercise Program Goal: Individual exercise prescription set using results from initial 6 min walk test  and THRR while considering  patient's activity barriers and safety.   Exercise Prescription Goal: Initial exercise prescription builds to 30-45 minutes a day of aerobic activity, 2-3 days per week.  Home exercise guidelines will be given to patient during program as part of exercise prescription that the participant will acknowledge.   Education: Aerobic Exercise: - Group verbal and visual presentation on the components of exercise prescription. Introduces F.I.T.T principle from ACSM for exercise prescriptions.  Reviews F.I.T.T. principles of aerobic exercise including progression. Written material provided at class time.   Education: Resistance Exercise: - Group verbal and visual presentation on the components of exercise prescription. Introduces F.I.T.T principle from ACSM for exercise prescriptions  Reviews F.I.T.T. principles of resistance exercise including progression. Written material provided at class time.    Education: Exercise & Equipment Safety: - Individual verbal instruction and demonstration of equipment use and safety with use of the equipment. Flowsheet Row Cardiac Rehab from 11/20/2023 in [facility] Cardiac and Pulmonary Rehab  Date 11/11/23  Educator MB  Instruction Review Code 1- Verbalizes Understanding    Education: Exercise Physiology & General Exercise Guidelines: - Group verbal and written instruction with models to review the exercise physiology of the cardiovascular system and associated critical values. Provides general exercise guidelines with specific guidelines to those with heart or lung disease. Written material provided at class time.   Education: Flexibility, Balance, Mind/Body Relaxation: - Group verbal and visual presentation with interactive activity on the components of exercise prescription. Introduces F.I.T.T principle from ACSM for exercise prescriptions. Reviews F.I.T.T. principles of flexibility and balance exercise training including progression. Also  discusses the mind body connection.  Reviews various relaxation techniques to help reduce and manage stress (i.e. Deep breathing, progressive muscle relaxation, and visualization). Balance handout provided to take home. Written material provided at class time.   Activity Barriers & Risk Stratification:  Activity Barriers & Cardiac Risk Stratification - 11/11/23 1148       Activity Barriers & Cardiac Risk Stratification   Activity Barriers Left Knee Replacement;Joint Problems;Back Problems;Neck/Spine Problems    Cardiac Risk Stratification Moderate          6 Minute Walk:  6 Minute Walk     Row Name 11/11/23 1147         6 Minute Walk   Phase Initial     Distance 1440 feet     Walk Time 6 minutes     # of Rest Breaks 0     MPH 2.73     METS 3.29     RPE 12     Perceived Dyspnea  1     VO2 Peak 11.53     Symptoms No     Resting  HR 55 bpm     Resting BP 108/62     Resting Oxygen Saturation  99 %     Exercise Oxygen Saturation  during 6 min walk 99 %     Max Ex. HR 90 bpm     Max Ex. BP 140/72     2 Minute Post BP 118/60        Oxygen Initial Assessment:   Oxygen Re-Evaluation:   Oxygen Discharge (Final Oxygen Re-Evaluation):   Initial Exercise Prescription:  Initial Exercise Prescription - 11/11/23 1100       Date of Initial Exercise RX and Referring Provider   Date 11/11/23    Referring Provider Tobie Rowan, MD      Oxygen   Maintain Oxygen Saturation 88% or higher      Treadmill   MPH 2.7    Grade 0    Minutes 15    METs 3.07      Recumbant Bike   Level 3    RPM 50    Watts 25    Minutes 15    METs 3.29      REL-XR   Level 2    Speed 50    Minutes 15    METs 3.29      T5 Nustep   Level 3    SPM 80    Minutes 15    METs 3.29      Track   Laps 38    Minutes 15    METs 3.07      Prescription Details   Frequency (times per week) 3    Duration Progress to 30 minutes of continuous aerobic without signs/symptoms of physical  distress      Intensity   THRR 40-80% of Max Heartrate 93-131    Ratings of Perceived Exertion 11-13    Perceived Dyspnea 0-4      Progression   Progression Continue to progress workloads to maintain intensity without signs/symptoms of physical distress.      Resistance Training   Training Prescription Yes    Weight 5 lb    Reps 10-15          Perform Capillary Blood Glucose checks as needed.  Exercise Prescription Changes:   Exercise Prescription Changes     Row Name 11/11/23 1100 11/19/23 1500           Response to Exercise   Blood Pressure (Admit) 108/62 126/72      Blood Pressure (Exercise) 140/72 142/64      Blood Pressure (Exit) 118/60 120/60      Heart Rate (Admit) 55 bpm 65 bpm      Heart Rate (Exercise) 90 bpm 100 bpm      Heart Rate (Exit) 57 bpm 61 bpm      Oxygen Saturation (Admit) 99 % --      Oxygen Saturation (Exercise) 99 % --      Oxygen Saturation (Exit) 98 % --      Rating of Perceived Exertion (Exercise) 12 14      Perceived Dyspnea (Exercise) 1 --      Symptoms none none      Comments results 1st week of exercise sessions      Duration -- Progress to 30 minutes of  aerobic without signs/symptoms of physical distress      Intensity -- THRR unchanged        Progression   Progression -- Continue to progress workloads to maintain intensity without signs/symptoms of  physical distress.      Average METs 3.29 3.04        Resistance Training   Training Prescription -- Yes      Weight -- 5 lb      Reps -- 10-15        Interval Training   Interval Training -- No        Treadmill   MPH -- 2.7      Grade -- 0      Minutes -- 15      METs -- 3.07        T5 Nustep   Level -- 4      Minutes -- 15      METs -- 2.5        Oxygen   Maintain Oxygen Saturation -- 88% or higher         Exercise Comments:   Exercise Comments     Row Name 11/13/23 1114           Exercise Comments First full day of exercise!  Patient was oriented  to gym and equipment including functions, settings, policies, and procedures.  Patient's individual exercise prescription and treatment plan were reviewed.  All starting workloads were established based on the results of the 6 minute walk test done at initial orientation visit.  The plan for exercise progression was also introduced and progression will be customized based on patient's performance and goals.          Exercise Goals and Review:   Exercise Goals     Row Name 11/11/23 1152             Exercise Goals   Increase Physical Activity Yes       Intervention Provide advice, education, support and counseling about physical activity/exercise needs.;Develop an individualized exercise prescription for aerobic and resistive training based on initial evaluation findings, risk stratification, comorbidities and participant's personal goals.       Expected Outcomes Short Term: Attend rehab on a regular basis to increase amount of physical activity.;Long Term: Add in home exercise to make exercise part of routine and to increase amount of physical activity.;Long Term: Exercising regularly at least 3-5 days a week.       Increase Strength and Stamina Yes       Intervention Provide advice, education, support and counseling about physical activity/exercise needs.;Develop an individualized exercise prescription for aerobic and resistive training based on initial evaluation findings, risk stratification, comorbidities and participant's personal goals.       Expected Outcomes Short Term: Increase workloads from initial exercise prescription for resistance, speed, and METs.;Short Term: Perform resistance training exercises routinely during rehab and add in resistance training at home;Long Term: Improve cardiorespiratory fitness, muscular endurance and strength as measured by increased METs and functional capacity ( )       Able to understand and use rate of perceived exertion (RPE) scale Yes        Intervention Provide education and explanation on how to use RPE scale       Expected Outcomes Short Term: Able to use RPE daily in rehab to express subjective intensity level;Long Term:  Able to use RPE to guide intensity level when exercising independently       Able to understand and use Dyspnea scale Yes       Intervention Provide education and explanation on how to use Dyspnea scale       Expected Outcomes Short Term: Able to use Dyspnea scale daily in  rehab to express subjective sense of shortness of breath during exertion;Long Term: Able to use Dyspnea scale to guide intensity level when exercising independently       Knowledge and understanding of Target Heart Rate Range (THRR) Yes       Intervention Provide education and explanation of THRR including how the numbers were predicted and where they are located for reference       Expected Outcomes Short Term: Able to state/look up THRR;Short Term: Able to use daily as guideline for intensity in rehab;Long Term: Able to use THRR to govern intensity when exercising independently       Able to check pulse independently Yes       Intervention Provide education and demonstration on how to check pulse in carotid and radial arteries.;Review the importance of being able to check your own pulse for safety during independent exercise       Expected Outcomes Short Term: Able to explain why pulse checking is important during independent exercise;Long Term: Able to check pulse independently and accurately       Understanding of Exercise Prescription Yes       Intervention Provide education, explanation, and written materials on patient's individual exercise prescription       Expected Outcomes Short Term: Able to explain program exercise prescription;Long Term: Able to explain home exercise prescription to exercise independently          Exercise Goals Re-Evaluation :  Exercise Goals Re-Evaluation     Row Name 11/13/23 1114 11/19/23 1510            Exercise Goal Re-Evaluation   Exercise Goals Review Increase Physical Activity;Able to understand and use rate of perceived exertion (RPE) scale;Knowledge and understanding of Target Heart Rate Range (THRR);Understanding of Exercise Prescription;Increase Strength and Stamina;Able to understand and use Dyspnea scale;Able to check pulse independently Increase Physical Activity;Understanding of Exercise Prescription;Increase Strength and Stamina      Comments Reviewed RPE and dyspnea scale, THR and program prescription with pt today.  Pt voiced understanding and was given a copy of goals to take home. Rykin is off to a good start in the program and completed his first week of exercise in this review. He tolerated his exercise prescription well. He had a workload on the treadmill of a speed of 2. and no incline. He also increased up to level 4 on the T5 nustep. We will continue to monitor his progress in the program.      Expected Outcomes Short: Use RPE daily to regulate intensity.  Long: Follow program prescription in THR. Short: Continue to follow current exercise prescription. Long: Continue exercise to improve strength and stamina.         Discharge Exercise Prescription (Final Exercise Prescription Changes):  Exercise Prescription Changes - 11/19/23 1500       Response to Exercise   Blood Pressure (Admit) 126/72    Blood Pressure (Exercise) 142/64    Blood Pressure (Exit) 120/60    Heart Rate (Admit) 65 bpm    Heart Rate (Exercise) 100 bpm    Heart Rate (Exit) 61 bpm    Rating of Perceived Exertion (Exercise) 14    Symptoms none    Comments 1st week of exercise sessions    Duration Progress to 30 minutes of  aerobic without signs/symptoms of physical distress    Intensity THRR unchanged      Progression   Progression Continue to progress workloads to maintain intensity without signs/symptoms of physical distress.  Average METs 3.04      Resistance Training   Training  Prescription Yes    Weight 5 lb    Reps 10-15      Interval Training   Interval Training No      Treadmill   MPH 2.7    Grade 0    Minutes 15    METs 3.07      T5 Nustep   Level 4    Minutes 15    METs 2.5      Oxygen   Maintain Oxygen Saturation 88% or higher          Nutrition:  Target Goals: Understanding of nutrition guidelines, daily intake of sodium 1500mg , cholesterol 200mg , calories 30% from fat and 7% or less from saturated fats, daily to have 5 or more servings of fruits and vegetables.  Education: Nutrition 1 -Group instruction provided by verbal, written material, interactive activities, discussions, models, and posters to present general guidelines for heart healthy nutrition including macronutrients, label reading, and promoting whole foods over processed counterparts. Education serves as Pensions consultant of discussion of heart healthy eating for all. Written material provided at class time.    Education: Nutrition 2 -Group instruction provided by verbal, written material, interactive activities, discussions, models, and posters to present general guidelines for heart healthy nutrition including sodium, cholesterol, and saturated fat. Providing guidance of habit forming to improve blood pressure, cholesterol, and body weight. Written material provided at class time.     Biometrics:  Pre Biometrics - 11/11/23 1152       Pre Biometrics   Height 5' 11.26 (1.81 m)    Weight 185 lb 6.4 oz (84.1 kg)    Waist Circumference 37.8 inches    Hip Circumference 40.5 inches    Waist to Hip Ratio 0.93 %    BMI (Calculated) 25.67    Single Leg Stand 30 seconds           Nutrition Therapy Plan and Nutrition Goals:  Nutrition Therapy & Goals - 11/25/23 1341       Nutrition Therapy   Diet Cardiac, Low na    Protein (specify units) 80    Fiber 30 grams    Whole Grain Foods 3 servings    Saturated Fats 15 max. grams    Fruits and Vegetables 5 servings/day     Sodium 2 grams      Personal Nutrition Goals   Nutrition Goal Read labels and reduce sodium intake to below 2300mg . Ideally 1500mg  per day.    Personal Goal #2 Reduce saturated fat, less than 12g per day. Replace bad fats for more heart healthy fats.    Personal Goal #3 Include more colorful produce, aim for 5-8 servings of fruits and veggies per day    Comments Patient drinking ~64oz of water daily. He is not drinking any caffeine anymore. He eats 2-3 meals per day. Reviewed his food recall. Educated on saturated fat and sodium in many of the foods he is eating. He has made a lot of changes from fast food to more home cooked meals. Provided mediterranean diet handout. Educated on types of fats, sources, and how to read fact labels. Brainstormed several meals and snacks with foods he likes and will eat focusing on reducing saturated fat or sodium.      Intervention Plan   Intervention Prescribe, educate and counsel regarding individualized specific dietary modifications aiming towards targeted core components such as weight, hypertension, lipid management, diabetes, heart failure and other comorbidities.;Nutrition  handout(s) given to patient.    Expected Outcomes Short Term Goal: Understand basic principles of dietary content, such as calories, fat, sodium, cholesterol and nutrients.;Short Term Goal: A plan has been developed with personal nutrition goals set during dietitian appointment.;Long Term Goal: Adherence to prescribed nutrition plan.          Nutrition Assessments:  MEDIFICTS Score Key: >=70 Need to make dietary changes  40-70 Heart Healthy Diet <= 40 Therapeutic Level Cholesterol Diet  Flowsheet Row Cardiac Rehab from 11/11/2023 in [facility] Cardiac and Pulmonary Rehab  Picture Your Plate Total Score on Admission 51   Picture Your Plate Scores: <59 Unhealthy dietary pattern with much room for improvement. 41-50 Dietary pattern unlikely to meet recommendations for good health and room  for improvement. 51-60 More healthful dietary pattern, with some room for improvement.  >60 Healthy dietary pattern, although there may be some specific behaviors that could be improved.    Nutrition Goals Re-Evaluation:   Nutrition Goals Discharge (Final Nutrition Goals Re-Evaluation):   Psychosocial: Target Goals: Acknowledge presence or absence of significant depression and/or stress, maximize coping skills, provide positive support system. Participant is able to verbalize types and ability to use techniques and skills needed for reducing stress and depression.   Education: Stress, Anxiety, and Depression - Group verbal and visual presentation to define topics covered.  Reviews how body is impacted by stress, anxiety, and depression.  Also discusses healthy ways to reduce stress and to treat/manage anxiety and depression. Written material provided at class time.   Education: Sleep Hygiene -Provides group verbal and written instruction about how sleep can affect your health.  Define sleep hygiene, discuss sleep cycles and impact of sleep habits. Review good sleep hygiene tips.   Initial Review & Psychosocial Screening:  Initial Psych Review & Screening - 11/05/23 1427       Initial Review   Current issues with None Identified      Family Dynamics   Good Support System? Yes      Barriers   Psychosocial barriers to participate in program There are no identifiable barriers or psychosocial needs.      Screening Interventions   Interventions Encouraged to exercise;Provide feedback about the scores to participant    Expected Outcomes Short Term goal: Utilizing psychosocial counselor, staff and physician to assist with identification of specific Stressors or current issues interfering with healing process. Setting desired goal for each stressor or current issue identified.;Long Term Goal: Stressors or current issues are controlled or eliminated.;Long Term goal: The participant  improves quality of Life and PHQ9 Scores as seen by post scores and/or verbalization of changes;Short Term goal: Identification and review with participant of any Quality of Life or Depression concerns found by scoring the questionnaire.          Quality of Life Scores:   Quality of Life - 11/11/23 1153       Quality of Life   Select Quality of Life      Quality of Life Scores   Health/Function Pre 23.3 %    Socioeconomic Pre 22.93 %    Psych/Spiritual Pre 22.93 %    Family Pre 25.8 %    GLOBAL Pre 23.51 %         Scores of 19 and below usually indicate a poorer quality of life in these areas.  A difference of  2-3 points is a clinically meaningful difference.  A difference of 2-3 points in the total score of the Quality of Life Index  has been associated with significant improvement in overall quality of life, self-image, physical symptoms, and general health in studies assessing change in quality of life.  PHQ-9: Review Flowsheet       11/11/2023  Depression screen PHQ 2/9  Decreased Interest 0  Down, Depressed, Hopeless 0  PHQ - 2 Score 0  Altered sleeping 0  Tired, decreased energy 0  Change in appetite 0  Feeling bad or failure about yourself  0  Trouble concentrating 0  Moving slowly or fidgety/restless 0  Suicidal thoughts 0  PHQ-9 Score 0  Difficult doing work/chores Not difficult at all   Interpretation of Total Score  Total Score Depression Severity:  1-4 = Minimal depression, 5-9 = Mild depression, 10-14 = Moderate depression, 15-19 = Moderately severe depression, 20-27 = Severe depression   Psychosocial Evaluation and Intervention:  Psychosocial Evaluation - 11/05/23 1427       Psychosocial Evaluation & Interventions   Interventions Encouraged to exercise with the program and follow exercise prescription    Comments Mr. Huish is coming to cardiac rehab after stent placement. He states he doesn't feel like he has any more stress than anyone else out there,  so he just takes it day by day. He has been walking more which helps boost his energy. He retired a little over a year ago and mentions that has taken time to get used to. He is curious to try the program and is ready to start.    Expected Outcomes Short: attend cardiac rehab for education and exercise Long: Develop and maintain positive self care habits    Continue Psychosocial Services  Follow up required by staff          Psychosocial Re-Evaluation:   Psychosocial Discharge (Final Psychosocial Re-Evaluation):   Vocational Rehabilitation: Provide vocational rehab assistance to qualifying candidates.   Vocational Rehab Evaluation & Intervention:  Vocational Rehab - 11/05/23 1417       Initial Vocational Rehab Evaluation & Intervention   Assessment shows need for Vocational Rehabilitation No          Education: Education Goals: Education classes will be provided on a variety of topics geared toward better understanding of heart health and risk factor modification. Participant will state understanding/return demonstration of topics presented as noted by education test scores.  Learning Barriers/Preferences:  Learning Barriers/Preferences - 11/05/23 1405       Learning Barriers/Preferences   Learning Barriers None    Learning Preferences None          General Cardiac Education Topics:  AED/CPR: - Group verbal and written instruction with the use of models to demonstrate the basic use of the AED with the basic ABC's of resuscitation.   Test and Procedures: - Group verbal and visual presentation and models provide information about basic cardiac anatomy and function. Reviews the testing methods done to diagnose heart disease and the outcomes of the test results. Describes the treatment choices: Medical Management, Angioplasty, or Coronary Bypass Surgery for treating various heart conditions including Myocardial Infarction, Angina, Valve Disease, and Cardiac Arrhythmias.  Written material provided at class time.   Medication Safety: - Group verbal and visual instruction to review commonly prescribed medications for heart and lung disease. Reviews the medication, class of the drug, and side effects. Includes the steps to properly store meds and maintain the prescription regimen. Written material provided at class time.   Intimacy: - Group verbal instruction through game format to discuss how heart and lung disease can affect sexual  intimacy. Written material provided at class time.   Know Your Numbers and Heart Failure: - Group verbal and visual instruction to discuss disease risk factors for cardiac and pulmonary disease and treatment options.  Reviews associated critical values for Overweight/Obesity, Hypertension, Cholesterol, and Diabetes.  Discusses basics of heart failure: signs/symptoms and treatments.  Introduces Heart Failure Zone chart for action plan for heart failure. Written material provided at class time. Flowsheet Row Cardiac Rehab from 11/20/2023 in [facility] Cardiac and Pulmonary Rehab  Date 11/13/23  Educator KB  Instruction Review Code 1- Verbalizes Understanding    Infection Prevention: - Provides verbal and written material to individual with discussion of infection control including proper hand washing and proper equipment cleaning during exercise session. Flowsheet Row Cardiac Rehab from 11/20/2023 in [facility] Cardiac and Pulmonary Rehab  Date 11/11/23  Educator MB  Instruction Review Code 1- Verbalizes Understanding    Falls Prevention: - Provides verbal and written material to individual with discussion of falls prevention and safety. Flowsheet Row Cardiac Rehab from 11/20/2023 in [facility] Cardiac and Pulmonary Rehab  Date 11/11/23  Educator MB  Instruction Review Code 1- Verbalizes Understanding    Other: -Provides group and verbal instruction on various topics (see comments)   Knowledge Questionnaire Score:  Knowledge Questionnaire  Score - 11/11/23 1155       Knowledge Questionnaire Score   Pre Score 26/26          Core Components/Risk Factors/Patient Goals at Admission:  Personal Goals and Risk Factors at Admission - 11/11/23 1155       Core Components/Risk Factors/Patient Goals on Admission    Weight Management Yes;Weight Maintenance    Intervention Weight Management: Develop a combined nutrition and exercise program designed to reach desired caloric intake, while maintaining appropriate intake of nutrient and fiber, sodium and fats, and appropriate energy expenditure required for the weight goal.;Weight Management: Provide education and appropriate resources to help participant work on and attain dietary goals.;Weight Management/Obesity: Establish reasonable short term and long term weight goals.    Admit Weight 185 lb 6.4 oz (84.1 kg)    Goal Weight: Short Term 185 lb 6.4 oz (84.1 kg)    Goal Weight: Long Term 185 lb 6.4 oz (84.1 kg)    Expected Outcomes Short Term: Continue to assess and modify interventions until short term weight is achieved;Long Term: Adherence to nutrition and physical activity/exercise program aimed toward attainment of established weight goal;Weight Maintenance: Understanding of the daily nutrition guidelines, which includes 25-35% calories from fat, 7% or less cal from saturated fats, less than 200mg  cholesterol, less than 1.5gm of sodium, & 5 or more servings of fruits and vegetables daily;Understanding recommendations for meals to include 15-35% energy as protein, 25-35% energy from fat, 35-60% energy from carbohydrates, less than 200mg  of dietary cholesterol, 20-35 gm of total fiber daily;Understanding of distribution of calorie intake throughout the day with the consumption of 4-5 meals/snacks    Diabetes Yes    Intervention Provide education about signs/symptoms and action to take for hypo/hyperglycemia.;Provide education about proper nutrition, including hydration, and aerobic/resistive  exercise prescription along with prescribed medications to achieve blood glucose in normal ranges: Fasting glucose 65-99 mg/dL    Expected Outcomes Short Term: Participant verbalizes understanding of the signs/symptoms and immediate care of hyper/hypoglycemia, proper foot care and importance of medication, aerobic/resistive exercise and nutrition plan for blood glucose control.;Long Term: Attainment of HbA1C < 7%.    Hypertension Yes    Intervention Provide education on lifestyle modifcations including regular  physical activity/exercise, weight management, moderate sodium restriction and increased consumption of fresh fruit, vegetables, and low fat dairy, alcohol moderation, and smoking cessation.;Monitor prescription use compliance.    Expected Outcomes Long Term: Maintenance of blood pressure at goal levels.;Short Term: Continued assessment and intervention until BP is < 140/11mm HG in hypertensive participants. < 130/61mm HG in hypertensive participants with diabetes, heart failure or chronic kidney disease.    Lipids Yes    Intervention Provide education and support for participant on nutrition & aerobic/resistive exercise along with prescribed medications to achieve LDL 70mg , HDL >40mg .    Expected Outcomes Short Term: Participant states understanding of desired cholesterol values and is compliant with medications prescribed. Participant is following exercise prescription and nutrition guidelines.;Long Term: Cholesterol controlled with medications as prescribed, with individualized exercise RX and with personalized nutrition plan. Value goals: LDL < 70mg , HDL > 40 mg.          Education:Diabetes - Individual verbal and written instruction to review signs/symptoms of diabetes, desired ranges of glucose level fasting, after meals and with exercise. Acknowledge that pre and post exercise glucose checks will be done for 3 sessions at entry of program. Flowsheet Row Cardiac Rehab from 11/20/2023 in  [facility] Cardiac and Pulmonary Rehab  Date 11/11/23  Educator MB  Instruction Review Code 1- Verbalizes Understanding    Core Components/Risk Factors/Patient Goals Review:    Core Components/Risk Factors/Patient Goals at Discharge (Final Review):    ITP Comments:  ITP Comments     Row Name 11/05/23 1414 11/11/23 1147 11/13/23 1114 11/27/23 1014     ITP Comments Initial phone call completed. Diagnosis can be found in [facility] 7/5. EP Orientation scheduled for Monday 9/8 10am. Completed and gym orientation for cardiac rehab. Initial ITP created and sent for review to Dr. Oneil Pinal, Medical Director. First full day of exercise!  Patient was oriented to gym and equipment including functions, settings, policies, and procedures.  Patient's individual exercise prescription and treatment plan were reviewed.  All starting workloads were established based on the results of the 6 minute walk test done at initial orientation visit.  The plan for exercise progression was also introduced and progression will be customized based on patient's performance and goals. 30 Day review completed. Medical Director ITP review done, changes made as directed, and signed approval by Medical Director. new to program.       Comments: 30 day review

## 2023-11-27 NOTE — Progress Notes (Signed)
 Daily Session Note  Patient Details  Name: Jeremy Luna MRN: [id number] Date of Birth: 11-May-1955 Referring Provider:   Flowsheet Row Cardiac Rehab from 11/11/2023 in [facility] Cardiac and Pulmonary Rehab  Referring Provider Tobie Rowan, MD    Encounter Date: 11/27/2023  Check In:  Session Check In - 11/27/23 1405       Check-In   Supervising physician immediately available to respond to emergencies See telemetry face sheet for immediately available ER MD    Location [facility]-Cardiac & Pulmonary Rehab    Staff Present Burnard Davenport RN,BSN,MPA;Laura Cates RN,BSN;Maxon Burnell BS, Exercise Physiologist;Noah Tickle, BS, Exercise Physiologist    Virtual Visit No    Medication changes reported     No    Fall or balance concerns reported    No    Warm-up and Cool-down Performed on first and last piece of equipment    Resistance Training Performed Yes    VAD Patient? No    PAD/SET Patient? No      Pain Assessment   Currently in Pain? No/denies             Social History   Tobacco Use  Smoking Status Never  Smokeless Tobacco Never    Goals Met:  Independence with exercise equipment Exercise tolerated well No report of concerns or symptoms today Strength training completed today  Goals Unmet:  Not Applicable  Comments: Pt able to follow exercise prescription today without complaint.  Will continue to monitor for progression.    Dr. Oneil Pinal is Medical Director for [facility] Cardiac Rehabilitation.  Dr. Fuad Aleskerov is Medical Director for [facility] Pulmonary Rehabilitation.

## 2023-11-29 ENCOUNTER — Encounter

## 2023-11-29 DIAGNOSIS — Z955 Presence of coronary angioplasty implant and graft: Secondary | ICD-10-CM

## 2023-11-29 DIAGNOSIS — I214 Non-ST elevation (NSTEMI) myocardial infarction: Secondary | ICD-10-CM

## 2023-11-29 DIAGNOSIS — I252 Old myocardial infarction: Secondary | ICD-10-CM | POA: Diagnosis not present

## 2023-11-29 NOTE — Progress Notes (Signed)
 Daily Session Note  Patient Details  Name: Jeremy Luna MRN: [id number] Date of Birth: [birth year] Referring Provider:   Flowsheet Row Cardiac Rehab from 11/11/2023 in [facility] Cardiac and Pulmonary Rehab  Referring Provider Tobie Rowan, MD    Encounter Date: 11/29/2023  Check In:  Session Check In - 11/29/23 1106       Check-In   Supervising physician immediately available to respond to emergencies See telemetry face sheet for immediately available ER MD    Location [facility]-Cardiac & Pulmonary Rehab    Staff Present Burnard Davenport RN,BSN,MPA;Joseph Hood RCP,RRT,BSRT;Maxon Conetta BS, Exercise Physiologist;Noah Tickle, BS, Exercise Physiologist    Virtual Visit No    Medication changes reported     No    Fall or balance concerns reported    No    Warm-up and Cool-down Performed on first and last piece of equipment    Resistance Training Performed Yes    VAD Patient? No    PAD/SET Patient? No      Pain Assessment   Currently in Pain? No/denies             Social History   Tobacco Use  Smoking Status Never  Smokeless Tobacco Never    Goals Met:  Independence with exercise equipment Exercise tolerated well No report of concerns or symptoms today Strength training completed today  Goals Unmet:  Not Applicable  Comments: Pt able to follow exercise prescription today without complaint.  Will continue to monitor for progression.    Dr. Oneil Pinal is Medical Director for [facility] Cardiac Rehabilitation.  Dr. Fuad Aleskerov is Medical Director for [facility] Pulmonary Rehabilitation.

## 2023-12-02 ENCOUNTER — Encounter

## 2023-12-02 DIAGNOSIS — Z955 Presence of coronary angioplasty implant and graft: Secondary | ICD-10-CM

## 2023-12-02 DIAGNOSIS — I252 Old myocardial infarction: Secondary | ICD-10-CM | POA: Diagnosis not present

## 2023-12-02 DIAGNOSIS — I214 Non-ST elevation (NSTEMI) myocardial infarction: Secondary | ICD-10-CM

## 2023-12-02 NOTE — Progress Notes (Signed)
 Daily Session Note  Patient Details  Name: Jeremy Luna MRN: [id number] Date of Birth: 1955-03-17 Referring Provider:   Flowsheet Row Cardiac Rehab from 11/11/2023 in [facility] Cardiac and Pulmonary Rehab  Referring Provider Tobie Rowan, MD    Encounter Date: 12/02/2023  Check In:  Session Check In - 12/02/23 1050       Check-In   Supervising physician immediately available to respond to emergencies See telemetry face sheet for immediately available ER MD    Location [facility]-Cardiac & Pulmonary Rehab    Staff Present Burnard Davenport [facility], RN, DNP, NE-BC;Joseph Rolinda RCP,RRT,BSRT;Laura Cates RN,BSN;Brannon Levene Klein BS, ACSM CEP, Exercise Physiologist    Virtual Visit No    Medication changes reported     No    Fall or balance concerns reported    No    Warm-up and Cool-down Performed on first and last piece of equipment    Resistance Training Performed Yes    VAD Patient? No    PAD/SET Patient? No      Pain Assessment   Currently in Pain? No/denies             Social History   Tobacco Use  Smoking Status Never  Smokeless Tobacco Never    Goals Met:  Independence with exercise equipment Exercise tolerated well No report of concerns or symptoms today Strength training completed today  Goals Unmet:  Not Applicable  Comments: Pt able to follow exercise prescription today without complaint.  Will continue to monitor for progression.    Dr. Oneil Pinal is Medical Director for [facility] Cardiac Rehabilitation.  Dr. Fuad Aleskerov is Medical Director for [facility] Pulmonary Rehabilitation.

## 2023-12-03 ENCOUNTER — Encounter

## 2023-12-03 DIAGNOSIS — I214 Non-ST elevation (NSTEMI) myocardial infarction: Secondary | ICD-10-CM

## 2023-12-03 DIAGNOSIS — I252 Old myocardial infarction: Secondary | ICD-10-CM | POA: Diagnosis not present

## 2023-12-03 DIAGNOSIS — Z955 Presence of coronary angioplasty implant and graft: Secondary | ICD-10-CM

## 2023-12-03 NOTE — Progress Notes (Signed)
 Daily Session Note  Patient Details  Name: Jeremy Luna MRN: [id number] Date of Birth: 17-Mar-1955 Referring Provider:   Flowsheet Row Cardiac Rehab from 11/11/2023 in [facility] Cardiac and Pulmonary Rehab  Referring Provider Tobie Rowan, MD    Encounter Date: 12/03/2023  Check In:  Session Check In - 12/03/23 1108       Check-In   Supervising physician immediately available to respond to emergencies See telemetry face sheet for immediately available ER MD    Location [facility]-Cardiac & Pulmonary Rehab    Staff Present Burnard Davenport RN,BSN,MPA;Maxon Conetta BS, Exercise Physiologist;Noah Tickle, BS, Exercise Physiologist;Meredith Tressa RN,BSN;Jason Elnor RDN,LDN    Virtual Visit No    Medication changes reported     No    Fall or balance concerns reported    No    Warm-up and Cool-down Performed on first and last piece of equipment    Resistance Training Performed Yes    VAD Patient? No    PAD/SET Patient? No      Pain Assessment   Currently in Pain? No/denies             Social History   Tobacco Use  Smoking Status Never  Smokeless Tobacco Never    Goals Met:  Independence with exercise equipment Exercise tolerated well No report of concerns or symptoms today Strength training completed today  Goals Unmet:  Not Applicable  Comments: Pt able to follow exercise prescription today without complaint.  Will continue to monitor for progression.    Dr. Oneil Pinal is Medical Director for [facility] Cardiac Rehabilitation.  Dr. Fuad Aleskerov is Medical Director for [facility] Pulmonary Rehabilitation.

## 2023-12-04 ENCOUNTER — Encounter

## 2023-12-06 ENCOUNTER — Encounter: Attending: Internal Medicine

## 2023-12-06 DIAGNOSIS — Z955 Presence of coronary angioplasty implant and graft: Secondary | ICD-10-CM | POA: Insufficient documentation

## 2023-12-06 DIAGNOSIS — I214 Non-ST elevation (NSTEMI) myocardial infarction: Secondary | ICD-10-CM | POA: Diagnosis present

## 2023-12-06 NOTE — Progress Notes (Signed)
 Daily Session Note  Patient Details  Name: Jeremy Luna MRN: [id number] Date of Birth: February 14, 1956 Referring Provider:   Flowsheet Row Cardiac Rehab from 11/11/2023 in [facility] Cardiac and Pulmonary Rehab  Referring Provider Tobie Rowan, MD    Encounter Date: 12/06/2023  Check In:  Session Check In - 12/06/23 1106       Check-In   Supervising physician immediately available to respond to emergencies See telemetry face sheet for immediately available ER MD    Location [facility]-Cardiac & Pulmonary Rehab    Staff Present Burnard Davenport RN,BSN,MPA;Laura Cates RN,BSN;Joseph [facility] BS, Exercise Physiologist    Virtual Visit No    Medication changes reported     No    Fall or balance concerns reported    No    Warm-up and Cool-down Performed on first and last piece of equipment    Resistance Training Performed Yes    VAD Patient? No    PAD/SET Patient? No      Pain Assessment   Currently in Pain? No/denies             Social History   Tobacco Use  Smoking Status Never  Smokeless Tobacco Never    Goals Met:  Independence with exercise equipment Exercise tolerated well No report of concerns or symptoms today Strength training completed today  Goals Unmet:  Not Applicable  Comments: Pt able to follow exercise prescription today without complaint.  Will continue to monitor for progression.    Dr. Oneil Pinal is Medical Director for [facility] Cardiac Rehabilitation.  Dr. Fuad Aleskerov is Medical Director for [facility] Pulmonary Rehabilitation.

## 2023-12-09 ENCOUNTER — Encounter

## 2023-12-09 DIAGNOSIS — I214 Non-ST elevation (NSTEMI) myocardial infarction: Secondary | ICD-10-CM

## 2023-12-09 DIAGNOSIS — Z955 Presence of coronary angioplasty implant and graft: Secondary | ICD-10-CM

## 2023-12-09 NOTE — Progress Notes (Signed)
 Daily Session Note  Patient Details  Name: Jeremy Luna MRN: [id number] Date of Birth: 05/10/1955 Referring Provider:   Flowsheet Row Cardiac Rehab from 11/11/2023 in [facility] Cardiac and Pulmonary Rehab  Referring Provider Tobie Rowan, MD    Encounter Date: 12/09/2023  Check In:  Session Check In - 12/09/23 1101       Check-In   Supervising physician immediately available to respond to emergencies See telemetry face sheet for immediately available ER MD    Location [facility]-Cardiac & Pulmonary Rehab    Staff Present Burnard Davenport RN,BSN,MPA;Joseph Rolinda RCP,RRT,BSRT;Laura Cates RN,BSN;Shellee Streng Dyane BS, ACSM CEP, Exercise Physiologist    Virtual Visit No    Medication changes reported     No    Fall or balance concerns reported    No    Warm-up and Cool-down Performed on first and last piece of equipment    Resistance Training Performed Yes    VAD Patient? No    PAD/SET Patient? No      Pain Assessment   Currently in Pain? No/denies             Social History   Tobacco Use  Smoking Status Never  Smokeless Tobacco Never    Goals Met:  Independence with exercise equipment Exercise tolerated well No report of concerns or symptoms today Strength training completed today  Goals Unmet:  Not Applicable  Comments: Pt able to follow exercise prescription today without complaint.  Will continue to monitor for progression.    Dr. Oneil Pinal is Medical Director for [facility] Cardiac Rehabilitation.  Dr. Fuad Aleskerov is Medical Director for [facility] Pulmonary Rehabilitation.

## 2023-12-11 ENCOUNTER — Encounter

## 2023-12-11 DIAGNOSIS — I214 Non-ST elevation (NSTEMI) myocardial infarction: Secondary | ICD-10-CM

## 2023-12-11 DIAGNOSIS — Z955 Presence of coronary angioplasty implant and graft: Secondary | ICD-10-CM

## 2023-12-11 NOTE — Progress Notes (Signed)
 Daily Session Note  Patient Details  Name: Jeremy Luna MRN: [id number] Date of Birth: Jun 26, 1955 Referring Provider:   Flowsheet Row Cardiac Rehab from 11/11/2023 in [facility] Cardiac and Pulmonary Rehab  Referring Provider Tobie Rowan, MD    Encounter Date: 12/11/2023  Check In:  Session Check In - 12/11/23 1109       Check-In   Supervising physician immediately available to respond to emergencies See telemetry face sheet for immediately available ER MD    Location [facility]-Cardiac & Pulmonary Rehab    Staff Present Burnard Davenport [facility] Peggi, RN, DNP, NE-BC;Joseph [facility] RN,BSN;Margaret Best, MS, Exercise Physiologist;Marlaine Arey Dyane BS, ACSM CEP, Exercise Physiologist    Virtual Visit No    Medication changes reported     Yes    Comments amlodipine dose increased from 2.5 to 5mg     Fall or balance concerns reported    No    Warm-up and Cool-down Performed on first and last piece of equipment    Resistance Training Performed Yes    VAD Patient? No    PAD/SET Patient? No      Pain Assessment   Currently in Pain? No/denies             Social History   Tobacco Use  Smoking Status Never  Smokeless Tobacco Never    Goals Met:  Independence with exercise equipment Exercise tolerated well No report of concerns or symptoms today Strength training completed today  Goals Unmet:  Not Applicable  Comments: Pt able to follow exercise prescription today without complaint.  Will continue to monitor for progression.    Dr. Oneil Pinal is Medical Director for [facility] Cardiac Rehabilitation.  Dr. Fuad Aleskerov is Medical Director for [facility] Pulmonary Rehabilitation.

## 2023-12-13 ENCOUNTER — Encounter

## 2023-12-16 ENCOUNTER — Encounter

## 2023-12-16 DIAGNOSIS — I214 Non-ST elevation (NSTEMI) myocardial infarction: Secondary | ICD-10-CM

## 2023-12-16 DIAGNOSIS — Z955 Presence of coronary angioplasty implant and graft: Secondary | ICD-10-CM

## 2023-12-16 NOTE — Progress Notes (Signed)
 Daily Session Note  Patient Details  Name: Jeremy Luna MRN: [id number] Date of Birth: 1955-10-06 Referring Provider:   Flowsheet Row Cardiac Rehab from 11/11/2023 in [facility] Cardiac and Pulmonary Rehab  Referring Provider Tobie Rowan, MD    Encounter Date: 12/16/2023  Check In:  Session Check In - 12/16/23 1100       Check-In   Supervising physician immediately available to respond to emergencies See telemetry face sheet for immediately available ER MD    Location [facility]-Cardiac & Pulmonary Rehab    Staff Present Burnard Davenport RN,BSN,MPA;Joseph Rolinda RCP,RRT,BSRT;Laura Cates RN,BSN;Haily Caley Dyane BS, ACSM CEP, Exercise Physiologist    Virtual Visit No    Medication changes reported     No    Fall or balance concerns reported    No    Warm-up and Cool-down Performed on first and last piece of equipment    Resistance Training Performed Yes    VAD Patient? No    PAD/SET Patient? No      Pain Assessment   Currently in Pain? No/denies             Social History   Tobacco Use  Smoking Status Never  Smokeless Tobacco Never    Goals Met:  Proper associated with RPD/PD & O2 Sat Independence with exercise equipment Exercise tolerated well No report of concerns or symptoms today Strength training completed today  Goals Unmet:  Not Applicable  Comments: Pt able to follow exercise prescription today without complaint.  Will continue to monitor for progression.    Dr. Oneil Pinal is Medical Director for [facility] Cardiac Rehabilitation.  Dr. Fuad Aleskerov is Medical Director for [facility] Pulmonary Rehabilitation.

## 2023-12-18 ENCOUNTER — Encounter

## 2023-12-18 DIAGNOSIS — I214 Non-ST elevation (NSTEMI) myocardial infarction: Secondary | ICD-10-CM | POA: Diagnosis not present

## 2023-12-18 DIAGNOSIS — Z955 Presence of coronary angioplasty implant and graft: Secondary | ICD-10-CM

## 2023-12-18 NOTE — Progress Notes (Signed)
 Daily Session Note  Patient Details  Name: Jeremy COPEN MRN: [id number] Date of Birth: 11-Sep-1955 Referring Provider:   Flowsheet Row Cardiac Rehab from 11/11/2023 in [facility] Cardiac and Pulmonary Rehab  Referring Provider Tobie Rowan, MD    Encounter Date: 12/18/2023  Check In:  Session Check In - 12/18/23 1038       Check-In   Supervising physician immediately available to respond to emergencies See telemetry face sheet for immediately available ER MD    Location [facility]-Cardiac & Pulmonary Rehab    Staff Present Burnard Davenport RN,BSN,MPA;Joseph Rolinda RCP,RRT,BSRT;Meredith Tressa RN,BSN;Vittorio Mohs Dyane BS, ACSM CEP, Exercise Physiologist    Virtual Visit No    Medication changes reported     No    Fall or balance concerns reported    No    Warm-up and Cool-down Performed on first and last piece of equipment    Resistance Training Performed Yes    VAD Patient? No    PAD/SET Patient? No      Pain Assessment   Currently in Pain? No/denies             Social History   Tobacco Use  Smoking Status Never  Smokeless Tobacco Never    Goals Met:  Independence with exercise equipment Exercise tolerated well No report of concerns or symptoms today Strength training completed today  Goals Unmet:  Not Applicable  Comments: Pt able to follow exercise prescription today without complaint.  Will continue to monitor for progression.    Dr. Oneil Pinal is Medical Director for [facility] Cardiac Rehabilitation.  Dr. Fuad Aleskerov is Medical Director for [facility] Pulmonary Rehabilitation.

## 2023-12-20 ENCOUNTER — Encounter

## 2023-12-20 DIAGNOSIS — I214 Non-ST elevation (NSTEMI) myocardial infarction: Secondary | ICD-10-CM | POA: Diagnosis not present

## 2023-12-20 DIAGNOSIS — Z955 Presence of coronary angioplasty implant and graft: Secondary | ICD-10-CM

## 2023-12-20 NOTE — Progress Notes (Signed)
 Daily Session Note  Patient Details  Name: Jeremy Luna MRN: [id number] Date of Birth: Dec 23, 1955 Referring Provider:   Flowsheet Row Cardiac Rehab from 11/11/2023 in [facility] Cardiac and Pulmonary Rehab  Referring Provider Tobie Rowan, MD    Encounter Date: 12/20/2023  Check In:  Session Check In - 12/20/23 1039       Check-In   Supervising physician immediately available to respond to emergencies See telemetry face sheet for immediately available ER MD    Location [facility]-Cardiac & Pulmonary Rehab    Staff Present Burnard Davenport RN,BSN,MPA;Maxon Conetta BS, Exercise Physiologist;Noah Tickle, BS, Exercise Physiologist;Laureen Delores, BS, RRT, CPFT    Virtual Visit No    Medication changes reported     No    Fall or balance concerns reported    No    Warm-up and Cool-down Performed on first and last piece of equipment    Resistance Training Performed Yes    VAD Patient? No    PAD/SET Patient? No      Pain Assessment   Currently in Pain? No/denies             Social History   Tobacco Use  Smoking Status Never  Smokeless Tobacco Never    Goals Met:  Proper associated with RPD/PD & O2 Sat Independence with exercise equipment Exercise tolerated well No report of concerns or symptoms today Strength training completed today  Goals Unmet:  Not Applicable  Comments: Pt able to follow exercise prescription today without complaint.  Will continue to monitor for progression.    Dr. Oneil Pinal is Medical Director for [facility].  Dr. Fuad Aleskerov is Medical Director for [facility] Pulmonary Rehabilitation.

## 2023-12-23 ENCOUNTER — Encounter

## 2023-12-23 DIAGNOSIS — I214 Non-ST elevation (NSTEMI) myocardial infarction: Secondary | ICD-10-CM | POA: Diagnosis not present

## 2023-12-23 DIAGNOSIS — Z955 Presence of coronary angioplasty implant and graft: Secondary | ICD-10-CM

## 2023-12-23 NOTE — Progress Notes (Signed)
 Daily Session Note  Patient Details  Name: Jeremy Luna MRN: [id number] Date of Birth: [birth year] Referring Provider:   Flowsheet Row Cardiac Rehab from 11/11/2023 in [facility] Cardiac and Pulmonary Rehab  Referring Provider Tobie Rowan, MD    Encounter Date: 12/23/2023  Check In:  Session Check In - 12/23/23 0924       Check-In   Supervising physician immediately available to respond to emergencies See telemetry face sheet for immediately available ER MD    Location [facility]-Cardiac & Pulmonary Rehab    Staff Present Burnard Davenport RN,BSN,MPA;Joseph Hood RCP,RRT,BSRT;Maxon Burnell BS, Exercise Physiologist;Avonlea Sima Dyane HECKLE, ACSM CEP, Exercise Physiologist;Jason Elnor RDN,LDN    Virtual Visit No    Medication changes reported     No    Fall or balance concerns reported    No    Warm-up and Cool-down Performed on first and last piece of equipment    Resistance Training Performed Yes    VAD Patient? No    PAD/SET Patient? No      Pain Assessment   Currently in Pain? No/denies             Social History   Tobacco Use  Smoking Status Never  Smokeless Tobacco Never    Goals Met:  Proper associated with RPD/PD & O2 Sat Independence with exercise equipment Exercise tolerated well No report of concerns or symptoms today Strength training completed today  Goals Unmet:  Not Applicable  Comments: Pt able to follow exercise prescription today without complaint.  Will continue to monitor for progression.    Dr. Oneil Pinal is Medical Director for [facility] Cardiac Rehabilitation.  Dr. Fuad Aleskerov is Medical Director for [facility] Pulmonary Rehabilitation.

## 2023-12-25 ENCOUNTER — Encounter: Admitting: Emergency Medicine

## 2023-12-25 DIAGNOSIS — I214 Non-ST elevation (NSTEMI) myocardial infarction: Secondary | ICD-10-CM | POA: Diagnosis not present

## 2023-12-25 DIAGNOSIS — Z955 Presence of coronary angioplasty implant and graft: Secondary | ICD-10-CM

## 2023-12-25 NOTE — Progress Notes (Signed)
 Daily Session Note  Patient Details  Name: Jeremy Luna MRN: [id number] Date of Birth: [birth year] Referring Provider:   Flowsheet Row Cardiac Rehab from 11/11/2023 in [facility] Cardiac and Pulmonary Rehab  Referring Provider Tobie Rowan, MD    Encounter Date: 12/25/2023  Check In:  Session Check In - 12/25/23 1122       Check-In   Supervising physician immediately available to respond to emergencies See telemetry face sheet for immediately available ER MD    Location [facility]-Cardiac & Pulmonary Rehab    Staff Present Burnard Hint BS, ACSM CEP, Exercise Physiologist;Noah Tickle, BS, Exercise Physiologist;Joseph Rolinda RCP,RRT,BSRT;Armel Rabbani RN,BSN    Virtual Visit No    Medication changes reported     No    Fall or balance concerns reported    No    Warm-up and Cool-down Performed on first and last piece of equipment    Resistance Training Performed Yes    VAD Patient? No    PAD/SET Patient? No      Pain Assessment   Currently in Pain? No/denies             Social History   Tobacco Use  Smoking Status Never  Smokeless Tobacco Never    Goals Met:  Independence with exercise equipment Exercise tolerated well No report of concerns or symptoms today Strength training completed today  Goals Unmet:  Not Applicable  Comments: Pt able to follow exercise prescription today without complaint.  Will continue to monitor for progression.    Dr. Oneil Pinal is Medical Director for [facility] Cardiac Rehabilitation.  Dr. Fuad Aleskerov is Medical Director for [facility] Pulmonary Rehabilitation.

## 2023-12-25 NOTE — Progress Notes (Signed)
 Cardiac Individual Treatment Plan  Patient Details  Name: Jeremy Luna MRN: [id number] Date of Birth: 1955/07/10 Referring Provider:   Flowsheet Row Cardiac Rehab from 11/11/2023 in [facility] Cardiac and Pulmonary Rehab  Referring Provider Tobie Rowan, MD    Initial Encounter Date:  Flowsheet Row Cardiac Rehab from 11/11/2023 in [facility] Cardiac and Pulmonary Rehab  Date 11/11/23    Visit Diagnosis: NSTEMI (non-ST elevated myocardial infarction) [facility])  Status post coronary artery stent placement  Patient's Home Medications on Admission:  Current Outpatient Medications:    ALPRAZolam (XANAX) 0.5 MG tablet, Take 0.5 mg by mouth at bedtime., Disp: , Rfl:    amLODipine (NORVASC) 2.5 MG tablet, Take 2.5 mg by mouth daily., Disp: , Rfl:    aspirin EC 81 MG tablet, Take 81 mg by mouth daily., Disp: , Rfl:    atorvastatin  (LIPITOR) 80 MG tablet, Take 80 mg by mouth daily., Disp: , Rfl:    baclofen  (LIORESAL ) 10 MG tablet, Take 1 tablet (10 mg total) by mouth 3 (three) times daily as needed for muscle spasms., Disp: 30 each, Rfl: 0   cephALEXin  (KEFLEX ) 500 MG capsule, Take 2 capsules (1,000 mg total) by mouth 2 (two) times daily., Disp: 28 capsule, Rfl: 0   clopidogrel (PLAVIX) 75 MG tablet, Take 75 mg by mouth daily., Disp: , Rfl:    HYDROcodone-acetaminophen  (NORCO/VICODIN) 5-325 MG tablet, Take 1 tablet by mouth every 6 (six) hours as needed., Disp: , Rfl:    lisinopril  (PRINIVIL ,ZESTRIL ) 20 MG tablet, Take 20 mg by mouth daily. , Disp: , Rfl:    MAGNESIUM  OXIDE PO, Take 500 mg by mouth daily., Disp: , Rfl:    metFORMIN  (GLUCOPHAGE ) 500 MG tablet, Take 500-1,000 mg by mouth 2 (two) times daily with a meal. Take 1000 mg by mouth in the morning and take 500 mg by mouth in the evening., Disp: , Rfl:    metoprolol succinate (TOPROL-XL) 25 MG 24 hr tablet, Take 12.5 mg by mouth daily., Disp: , Rfl:    nitroGLYCERIN  (NITROSTAT ) 0.4 MG SL tablet, Place 0.4 mg under the tongue every 5 (five) minutes as  needed for chest pain., Disp: , Rfl:    omeprazole (PRILOSEC) 20 MG capsule, Take 20 mg by mouth daily. , Disp: , Rfl:    ondansetron  (ZOFRAN  ODT) 4 MG disintegrating tablet, Take 1 tablet (4 mg total) by mouth every 8 (eight) hours as needed for nausea or vomiting., Disp: 20 tablet, Rfl: 0   predniSONE (DELTASONE) 20 MG tablet, Take by mouth., Disp: , Rfl:    sertraline (ZOLOFT) 50 MG tablet, Take 1 tablet by mouth daily., Disp: , Rfl:   Past Medical History: Past Medical History:  Diagnosis Date   Benign essential hypertension 04/02/2016   CAD (coronary artery disease)    Carotid artery disease    stent PCI 11/1999, antioplasty 2/02, persistent chest pain   Cholelithiasis    Coronary artery disease 12/15/2010   Overview:   Progressive angina - onset early 2001.   08/1999 Worsening angina, stent PCI of the RCA ([place]).   11/1999 Recurrent angina, stent PCI of the LAD ([place]).   03/2000 Persistent intermittent chest discomfort Relook catheterization revealing in-stent restenosis of the LAD.   04/2000 Redo angioplasty of the LAD.   Persistent intermittent chest pain.   09/11/2000 Relook catheterizat   Diabetes mellitus without complication (HCC)    Dyslipidemia    GERD (gastroesophageal reflux disease)    H/O angioplasty    Hypertension  Leg pain 07/27/2013   Overview:   12/2012 ABIs: R> 1.39, L. 1.40   Other dysphagia 06/22/2016   PONV (postoperative nausea and vomiting)    pt also says he is hard to wake up    Schatzki's ring    Symptomatic cholelithiasis    Tubular adenoma 04/02/2016   Overview:  6/132    Tobacco Use: Social History   Tobacco Use  Smoking Status Never  Smokeless Tobacco Never    Labs: Review Flowsheet       Latest Ref Rng & Units 05/07/2018  Labs for ITP Cardiac and Pulmonary Rehab  Hemoglobin A1c 4.8 - 5.6 % 6.5      Exercise Target Goals: Exercise Program Goal: Individual exercise prescription set using results from initial 6 min walk test  and THRR while considering  patient's activity barriers and safety.   Exercise Prescription Goal: Initial exercise prescription builds to 30-45 minutes a day of aerobic activity, 2-3 days per week.  Home exercise guidelines will be given to patient during program as part of exercise prescription that the participant will acknowledge.   Education: Aerobic Exercise: - Group verbal and visual presentation on the components of exercise prescription. Introduces F.I.T.T principle from ACSM for exercise prescriptions.  Reviews F.I.T.T. principles of aerobic exercise including progression. Written material provided at class time. Flowsheet Row Cardiac Rehab from 12/18/2023 in [facility] Cardiac and Pulmonary Rehab  Date 12/18/23  Educator nt  Instruction Review Code 1- TEFL teacher Understanding    Education: Resistance Exercise: - Group verbal and visual presentation on the components of exercise prescription. Introduces F.I.T.T principle from ACSM for exercise prescriptions  Reviews F.I.T.T. principles of resistance exercise including progression. Written material provided at class time. Flowsheet Row Cardiac Rehab from 12/18/2023 in [facility] Cardiac and Pulmonary Rehab  Date 12/11/23  Educator nt  Instruction Review Code 1- TEFL teacher Understanding     Education: Exercise & Equipment Safety: - Individual verbal instruction and demonstration of equipment use and safety with use of the equipment. Flowsheet Row Cardiac Rehab from 12/18/2023 in [facility] Cardiac and Pulmonary Rehab  Date 11/11/23  Educator MB  Instruction Review Code 1- Verbalizes Understanding    Education: Exercise Physiology & General Exercise Guidelines: - Group verbal and written instruction with models to review the exercise physiology of the cardiovascular system and associated critical values. Provides general exercise guidelines with specific guidelines to those with heart or lung disease. Written material provided at class  time.   Education: Flexibility, Balance, Mind/Body Relaxation: - Group verbal and visual presentation with interactive activity on the components of exercise prescription. Introduces F.I.T.T principle from ACSM for exercise prescriptions. Reviews F.I.T.T. principles of flexibility and balance exercise training including progression. Also discusses the mind body connection.  Reviews various relaxation techniques to help reduce and manage stress (i.e. Deep breathing, progressive muscle relaxation, and visualization). Balance handout provided to take home. Written material provided at class time. Flowsheet Row Cardiac Rehab from 12/18/2023 in [facility] Cardiac and Pulmonary Rehab  Date 12/11/23  Educator nt  Instruction Review Code 1- Verbalizes Understanding    Activity Barriers & Risk Stratification:  Activity Barriers & Cardiac Risk Stratification - 11/11/23 1148       Activity Barriers & Cardiac Risk Stratification   Activity Barriers Left Knee Replacement;Joint Problems;Back Problems;Neck/Spine Problems    Cardiac Risk Stratification Moderate          6 Minute Walk:  6 Minute Walk     Row Name 11/11/23 1147  6 Minute Walk   Phase Initial     Distance 1440 feet     Walk Time 6 minutes     # of Rest Breaks 0     MPH 2.73     METS 3.29     RPE 12     Perceived Dyspnea  1     VO2 Peak 11.53     Symptoms No     Resting HR 55 bpm     Resting BP 108/62     Resting Oxygen Saturation  99 %     Exercise Oxygen Saturation  during 6 min walk 99 %     Max Ex. HR 90 bpm     Max Ex. BP 140/72     2 Minute Post BP 118/60        Oxygen Initial Assessment:   Oxygen Re-Evaluation:   Oxygen Discharge (Final Oxygen Re-Evaluation):   Initial Exercise Prescription:  Initial Exercise Prescription - 11/11/23 1100       Date of Initial Exercise RX and Referring Provider   Date 11/11/23    Referring Provider Tobie Rowan, MD      Oxygen   Maintain Oxygen Saturation 88%  or higher      Treadmill   MPH 2.7    Grade 0    Minutes 15    METs 3.07      Recumbant Bike   Level 3    RPM 50    Watts 25    Minutes 15    METs 3.29      REL-XR   Level 2    Speed 50    Minutes 15    METs 3.29      T5 Nustep   Level 3    SPM 80    Minutes 15    METs 3.29      Track   Laps 38    Minutes 15    METs 3.07      Prescription Details   Frequency (times per week) 3    Duration Progress to 30 minutes of continuous aerobic without signs/symptoms of physical distress      Intensity   THRR 40-80% of Max Heartrate 93-131    Ratings of Perceived Exertion 11-13    Perceived Dyspnea 0-4      Progression   Progression Continue to progress workloads to maintain intensity without signs/symptoms of physical distress.      Resistance Training   Training Prescription Yes    Weight 5 lb    Reps 10-15          Perform Capillary Blood Glucose checks as needed.  Exercise Prescription Changes:   Exercise Prescription Changes     Row Name 11/11/23 1100 11/19/23 1500 12/02/23 1100 12/19/23 0900       Response to Exercise   Blood Pressure (Admit) 108/62 126/72 122/60 122/62    Blood Pressure (Exercise) 140/72 142/64 154/82 --    Blood Pressure (Exit) 118/60 120/60 112/50 120/68    Heart Rate (Admit) 55 bpm 65 bpm 70 bpm 85 bpm    Heart Rate (Exercise) 90 bpm 100 bpm 115 bpm 102 bpm    Heart Rate (Exit) 57 bpm 61 bpm 66 bpm 68 bpm    Oxygen Saturation (Admit) 99 % -- -- --    Oxygen Saturation (Exercise) 99 % -- -- --    Oxygen Saturation (Exit) 98 % -- -- --    Rating of Perceived Exertion (Exercise) 12 14  13 13    Perceived Dyspnea (Exercise) 1 -- -- --    Symptoms none none none none    Comments results 1st week of exercise sessions -- --    Duration -- Progress to 30 minutes of  aerobic without signs/symptoms of physical distress Progress to 30 minutes of  aerobic without signs/symptoms of physical distress Continue with 30 min of aerobic  exercise without signs/symptoms of physical distress.    Intensity -- THRR unchanged THRR unchanged THRR unchanged      Progression   Progression -- Continue to progress workloads to maintain intensity without signs/symptoms of physical distress. Continue to progress workloads to maintain intensity without signs/symptoms of physical distress. Continue to progress workloads to maintain intensity without signs/symptoms of physical distress.    Average METs 3.29 3.04 3.08 3.21      Resistance Training   Training Prescription -- Yes Yes Yes    Weight -- 5 lb 5 lb 5 lb    Reps -- 10-15 10-15 10-15      Interval Training   Interval Training -- No No No      Treadmill   MPH -- 2.7 3 2.7    Grade -- 0 -- 0    Minutes -- 15 15 15     METs -- 3.07 3.3 3.07      NuStep   Level -- -- -- 5  T6 nustep    Minutes -- -- -- 15    METs -- -- -- 2.4      REL-XR   Level -- -- 6 9    Minutes -- -- 15 15    METs -- -- 4.1 6.2      T5 Nustep   Level -- 4 5 6     Minutes -- 15 15 15     METs -- 2.5 2.1 2.8      Oxygen   Maintain Oxygen Saturation -- 88% or higher 88% or higher 88% or higher       Exercise Comments:   Exercise Comments     Row Name 11/13/23 1114           Exercise Comments First full day of exercise!  Patient was oriented to gym and equipment including functions, settings, policies, and procedures.  Patient's individual exercise prescription and treatment plan were reviewed.  All starting workloads were established based on the results of the 6 minute walk test done at initial orientation visit.  The plan for exercise progression was also introduced and progression will be customized based on patient's performance and goals.          Exercise Goals and Review:   Exercise Goals     Row Name 11/11/23 1152             Exercise Goals   Increase Physical Activity Yes       Intervention Provide advice, education, support and counseling about physical activity/exercise  needs.;Develop an individualized exercise prescription for aerobic and resistive training based on initial evaluation findings, risk stratification, comorbidities and participant's personal goals.       Expected Outcomes Short Term: Attend rehab on a regular basis to increase amount of physical activity.;Long Term: Add in home exercise to make exercise part of routine and to increase amount of physical activity.;Long Term: Exercising regularly at least 3-5 days a week.       Increase Strength and Stamina Yes       Intervention Provide advice, education, support and counseling about physical  activity/exercise needs.;Develop an individualized exercise prescription for aerobic and resistive training based on initial evaluation findings, risk stratification, comorbidities and participant's personal goals.       Expected Outcomes Short Term: Increase workloads from initial exercise prescription for resistance, speed, and METs.;Short Term: Perform resistance training exercises routinely during rehab and add in resistance training at home;Long Term: Improve cardiorespiratory fitness, muscular endurance and strength as measured by increased METs and functional capacity ( )       Able to understand and use rate of perceived exertion (RPE) scale Yes       Intervention Provide education and explanation on how to use RPE scale       Expected Outcomes Short Term: Able to use RPE daily in rehab to express subjective intensity level;Long Term:  Able to use RPE to guide intensity level when exercising independently       Able to understand and use Dyspnea scale Yes       Intervention Provide education and explanation on how to use Dyspnea scale       Expected Outcomes Short Term: Able to use Dyspnea scale daily in rehab to express subjective sense of shortness of breath during exertion;Long Term: Able to use Dyspnea scale to guide intensity level when exercising independently       Knowledge and understanding of  Target Heart Rate Range (THRR) Yes       Intervention Provide education and explanation of THRR including how the numbers were predicted and where they are located for reference       Expected Outcomes Short Term: Able to state/look up THRR;Short Term: Able to use daily as guideline for intensity in rehab;Long Term: Able to use THRR to govern intensity when exercising independently       Able to check pulse independently Yes       Intervention Provide education and demonstration on how to check pulse in carotid and radial arteries.;Review the importance of being able to check your own pulse for safety during independent exercise       Expected Outcomes Short Term: Able to explain why pulse checking is important during independent exercise;Long Term: Able to check pulse independently and accurately       Understanding of Exercise Prescription Yes       Intervention Provide education, explanation, and written materials on patient's individual exercise prescription       Expected Outcomes Short Term: Able to explain program exercise prescription;Long Term: Able to explain home exercise prescription to exercise independently          Exercise Goals Re-Evaluation :  Exercise Goals Re-Evaluation     Row Name 11/13/23 1114 11/19/23 1510 12/02/23 1131 12/18/23 1105 12/19/23 0922     Exercise Goal Re-Evaluation   Exercise Goals Review Increase Physical Activity;Able to understand and use rate of perceived exertion (RPE) scale;Knowledge and understanding of Target Heart Rate Range (THRR);Understanding of Exercise Prescription;Increase Strength and Stamina;Able to understand and use Dyspnea scale;Able to check pulse independently Increase Physical Activity;Understanding of Exercise Prescription;Increase Strength and Stamina Increase Physical Activity;Understanding of Exercise Prescription;Increase Strength and Stamina Increase Physical Activity;Increase Strength and Stamina;Understanding of Exercise  Prescription Increase Physical Activity;Increase Strength and Stamina;Understanding of Exercise Prescription   Comments Reviewed RPE and dyspnea scale, THR and program prescription with pt today.  Pt voiced understanding and was given a copy of goals to take home. Kolston is off to a good start in the program and completed his first week of exercise in this review. He tolerated  his exercise prescription well. He had a workload on the treadmill of a speed of 2. and no incline. He also increased up to level 4 on the T5 nustep. We will continue to monitor his progress in the program. Demarlo continues to do well in rehab. He was recently able to increase his speed on the treadmill from 2.7mph to . He was also able to increase from level 2 to 6 on the XR. We will continue to monitor his progress in the program. Deyton is doing well with rehab. He is walking on days off from rehab, getting around 30-50mins of exercise on top of being active around the house. Halvor is doing well in rehab. He continues to do well walking on the treadmill at a speed of 2.7 mph with no incline. He recently improved to level 6 on the T5 nustep and level 9 on the XR. We will continue to monitor his progress in the program.   Expected Outcomes Short: Use RPE daily to regulate intensity.  Long: Follow program prescription in THR. Short: Continue to follow current exercise prescription. Long: Continue exercise to improve strength and stamina. Short: Continue to follow current exercise prescription. Long: Continue exercise to improve strength and stamina. STG: Continue to exercise at home and attend rehab. LTG: Continue exercise to improve strength and stamina. Short: Begin to progressively increase treadmill workload. Long: Continue exercise to improve strength and stamina.      Discharge Exercise Prescription (Final Exercise Prescription Changes):  Exercise Prescription Changes - 12/19/23 0900       Response to Exercise   Blood  Pressure (Admit) 122/62    Blood Pressure (Exit) 120/68    Heart Rate (Admit) 85 bpm    Heart Rate (Exercise) 102 bpm    Heart Rate (Exit) 68 bpm    Rating of Perceived Exertion (Exercise) 13    Symptoms none    Duration Continue with 30 min of aerobic exercise without signs/symptoms of physical distress.    Intensity THRR unchanged      Progression   Progression Continue to progress workloads to maintain intensity without signs/symptoms of physical distress.    Average METs 3.21      Resistance Training   Training Prescription Yes    Weight 5 lb    Reps 10-15      Interval Training   Interval Training No      Treadmill   MPH 2.7    Grade 0    Minutes 15    METs 3.07      NuStep   Level 5   T6 nustep   Minutes 15    METs 2.4      REL-XR   Level 9    Minutes 15    METs 6.2      T5 Nustep   Level 6    Minutes 15    METs 2.8      Oxygen   Maintain Oxygen Saturation 88% or higher          Nutrition:  Target Goals: Understanding of nutrition guidelines, daily intake of sodium 1500mg , cholesterol 200mg , calories 30% from fat and 7% or less from saturated fats, daily to have 5 or more servings of fruits and vegetables.  Education: Nutrition 1 -Group instruction provided by verbal, written material, interactive activities, discussions, models, and posters to present general guidelines for heart healthy nutrition including macronutrients, label reading, and promoting whole foods over processed counterparts. Education serves as Pensions consultant of discussion of heart  healthy eating for all. Written material provided at class time.    Education: Nutrition 2 -Group instruction provided by verbal, written material, interactive activities, discussions, models, and posters to present general guidelines for heart healthy nutrition including sodium, cholesterol, and saturated fat. Providing guidance of habit forming to improve blood pressure, cholesterol, and body weight.  Written material provided at class time.     Biometrics:  Pre Biometrics - 11/11/23 1152       Pre Biometrics   Height 5' 11.26 (1.81 m)    Weight 185 lb 6.4 oz (84.1 kg)    Waist Circumference 37.8 inches    Hip Circumference 40.5 inches    Waist to Hip Ratio 0.93 %    BMI (Calculated) 25.67    Single Leg Stand 30 seconds           Nutrition Therapy Plan and Nutrition Goals:  Nutrition Therapy & Goals - 11/25/23 1341       Nutrition Therapy   Diet Cardiac, Low na    Protein (specify units) 80    Fiber 30 grams    Whole Grain Foods 3 servings    Saturated Fats 15 max. grams    Fruits and Vegetables 5 servings/day    Sodium 2 grams      Personal Nutrition Goals   Nutrition Goal Read labels and reduce sodium intake to below 2300mg . Ideally 1500mg  per day.    Personal Goal #2 Reduce saturated fat, less than 12g per day. Replace bad fats for more heart healthy fats.    Personal Goal #3 Include more colorful produce, aim for 5-8 servings of fruits and veggies per day    Comments Patient drinking ~64oz of water daily. He is not drinking any caffeine anymore. He eats 2-3 meals per day. Reviewed his food recall. Educated on saturated fat and sodium in many of the foods he is eating. He has made a lot of changes from fast food to more home cooked meals. Provided mediterranean diet handout. Educated on types of fats, sources, and how to read fact labels. Brainstormed several meals and snacks with foods he likes and will eat focusing on reducing saturated fat or sodium.      Intervention Plan   Intervention Prescribe, educate and counsel regarding individualized specific dietary modifications aiming towards targeted core components such as weight, hypertension, lipid management, diabetes, heart failure and other comorbidities.;Nutrition handout(s) given to patient.    Expected Outcomes Short Term Goal: Understand basic principles of dietary content, such as calories, fat, sodium,  cholesterol and nutrients.;Short Term Goal: A plan has been developed with personal nutrition goals set during dietitian appointment.;Long Term Goal: Adherence to prescribed nutrition plan.          Nutrition Assessments:  MEDIFICTS Score Key: >=70 Need to make dietary changes  40-70 Heart Healthy Diet <= 40 Therapeutic Level Cholesterol Diet  Flowsheet Row Cardiac Rehab from 11/11/2023 in [facility] Cardiac and Pulmonary Rehab  Picture Your Plate Total Score on Admission 51   Picture Your Plate Scores: <59 Unhealthy dietary pattern with much room for improvement. 41-50 Dietary pattern unlikely to meet recommendations for good health and room for improvement. 51-60 More healthful dietary pattern, with some room for improvement.  >60 Healthy dietary pattern, although there may be some specific behaviors that could be improved.    Nutrition Goals Re-Evaluation:  Nutrition Goals Re-Evaluation     Row Name 12/18/23 1111             Goals  Comment Jamaal is doing better with nutrition since meeting with RD, he is eating less red meat and switched to more whole grain bread. Has been working on including more veggies at meals       Expected Outcome STG: Continue to cut back on saturated fat rich foods and monitor sodium. LTG: Follow a heart healthy lifestyle          Nutrition Goals Discharge (Final Nutrition Goals Re-Evaluation):  Nutrition Goals Re-Evaluation - 12/18/23 1111       Goals   Comment Tyrrell is doing better with nutrition since meeting with RD, he is eating less red meat and switched to more whole grain bread. Has been working on including more veggies at meals    Expected Outcome STG: Continue to cut back on saturated fat rich foods and monitor sodium. LTG: Follow a heart healthy lifestyle          Psychosocial: Target Goals: Acknowledge presence or absence of significant depression and/or stress, maximize coping skills, provide positive support system. Participant  is able to verbalize types and ability to use techniques and skills needed for reducing stress and depression.   Education: Stress, Anxiety, and Depression - Group verbal and visual presentation to define topics covered.  Reviews how body is impacted by stress, anxiety, and depression.  Also discusses healthy ways to reduce stress and to treat/manage anxiety and depression. Written material provided at class time. Flowsheet Row Cardiac Rehab from 12/18/2023 in [facility] Cardiac and Pulmonary Rehab  Date 11/27/23  Educator mc  Instruction Review Code 1- Verbalizes Understanding    Education: Sleep Hygiene -Provides group verbal and written instruction about how sleep can affect your health.  Define sleep hygiene, discuss sleep cycles and impact of sleep habits. Review good sleep hygiene tips.   Initial Review & Psychosocial Screening:  Initial Psych Review & Screening - 11/05/23 1427       Initial Review   Current issues with None Identified      Family Dynamics   Good Support System? Yes      Barriers   Psychosocial barriers to participate in program There are no identifiable barriers or psychosocial needs.      Screening Interventions   Interventions Encouraged to exercise;Provide feedback about the scores to participant    Expected Outcomes Short Term goal: Utilizing psychosocial counselor, staff and physician to assist with identification of specific Stressors or current issues interfering with healing process. Setting desired goal for each stressor or current issue identified.;Long Term Goal: Stressors or current issues are controlled or eliminated.;Long Term goal: The participant improves quality of Life and PHQ9 Scores as seen by post scores and/or verbalization of changes;Short Term goal: Identification and review with participant of any Quality of Life or Depression concerns found by scoring the questionnaire.          Quality of Life Scores:   Quality of Life - 11/11/23 1153        Quality of Life   Select Quality of Life      Quality of Life Scores   Health/Function Pre 23.3 %    Socioeconomic Pre 22.93 %    Psych/Spiritual Pre 22.93 %    Family Pre 25.8 %    GLOBAL Pre 23.51 %         Scores of 19 and below usually indicate a poorer quality of life in these areas.  A difference of  2-3 points is a clinically meaningful difference.  A difference of 2-3 points  in the total score of the Quality of Life Index has been associated with significant improvement in overall quality of life, self-image, physical symptoms, and general health in studies assessing change in quality of life.  PHQ-9: Review Flowsheet       11/11/2023  Depression screen PHQ 2/9  Decreased Interest 0  Down, Depressed, Hopeless 0  PHQ - 2 Score 0  Altered sleeping 0  Tired, decreased energy 0  Change in appetite 0  Feeling bad or failure about yourself  0  Trouble concentrating 0  Moving slowly or fidgety/restless 0  Suicidal thoughts 0  PHQ-9 Score 0  Difficult doing work/chores Not difficult at all   Interpretation of Total Score  Total Score Depression Severity:  1-4 = Minimal depression, 5-9 = Mild depression, 10-14 = Moderate depression, 15-19 = Moderately severe depression, 20-27 = Severe depression   Psychosocial Evaluation and Intervention:  Psychosocial Evaluation - 11/05/23 1427       Psychosocial Evaluation & Interventions   Interventions Encouraged to exercise with the program and follow exercise prescription    Comments Mr. 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 He states he doesn't feel like he has any more stress than anyone else out there, so he just takes it day by day. He has been walking more which helps boost his energy. He retired a little over a year ago and mentions that has taken time to get used to. He is curious to try the program and is ready to start.    Expected Outcomes Short: attend cardiac rehab for education and exercise Long:  Develop and maintain positive self care habits    Continue Psychosocial Services  Follow up required by staff          Psychosocial Re-Evaluation:  Psychosocial Re-Evaluation     Row Name 12/18/23 1107             Psychosocial Re-Evaluation   Current issues with None Identified       Comments Haskell denies any anxiety, depression, or stress at this time. He reports he has a good support system. Says he wakes up in the middle of the night to use bathroom but falls back to sleep quickly.       Expected Outcomes STG: Continue to attend rehab consistently. LTG: Achieve and maintain a positive outlook on health and daily life       Interventions Encouraged to attend Cardiac Rehabilitation for the exercise       Continue Psychosocial Services  Follow up required by staff          Psychosocial Discharge (Final Psychosocial Re-Evaluation):  Psychosocial Re-Evaluation - 12/18/23 1107       Psychosocial Re-Evaluation   Current issues with None Identified    Comments Joby denies any anxiety, depression, or stress at this time. He reports he has a good support system. Says he wakes up in the middle of the night to use bathroom but falls back to sleep quickly.    Expected Outcomes STG: Continue to attend rehab consistently. LTG: Achieve and maintain a positive outlook on health and daily life    Interventions Encouraged to attend Cardiac Rehabilitation for the exercise    Continue Psychosocial Services  Follow up required by staff          Vocational Rehabilitation: Provide vocational rehab assistance to qualifying candidates.   Vocational Rehab Evaluation & Intervention:  Vocational Rehab - 11/05/23 1417       Initial Vocational  Rehab Evaluation & Intervention   Assessment shows need for Vocational Rehabilitation No          Education: Education Goals: Education classes will be provided on a variety of topics geared toward better understanding of heart health and risk  factor modification. Participant will state understanding/return demonstration of topics presented as noted by education test scores.  Learning Barriers/Preferences:  Learning Barriers/Preferences - 11/05/23 1405       Learning Barriers/Preferences   Learning Barriers None    Learning Preferences None          General Cardiac Education Topics:  AED/CPR: - Group verbal and written instruction with the use of models to demonstrate the basic use of the AED with the basic ABC's of resuscitation.   Test and Procedures: - Group verbal and visual presentation and models provide information about basic cardiac anatomy and function. Reviews the testing methods done to diagnose heart disease and the outcomes of the test results. Describes the treatment choices: Medical Management, Angioplasty, or Coronary Bypass Surgery for treating various heart conditions including Myocardial Infarction, Angina, Valve Disease, and Cardiac Arrhythmias. Written material provided at class time.   Medication Safety: - Group verbal and visual instruction to review commonly prescribed medications for heart and lung disease. Reviews the medication, class of the drug, and side effects. Includes the steps to properly store meds and maintain the prescription regimen. Written material provided at class time.   Intimacy: - Group verbal instruction through game format to discuss how heart and lung disease can affect sexual intimacy. Written material provided at class time. Flowsheet Row Cardiac Rehab from 12/18/2023 in [facility] Cardiac and Pulmonary Rehab  Date 12/18/23  Educator nt  Instruction Review Code 1- TEFL teacher Understanding    Know Your Numbers and Heart Failure: - Group verbal and visual instruction to discuss disease risk factors for cardiac and pulmonary disease and treatment options.  Reviews associated critical values for Overweight/Obesity, Hypertension, Cholesterol, and Diabetes.  Discusses basics of  heart failure: signs/symptoms and treatments.  Introduces Heart Failure Zone chart for action plan for heart failure. Written material provided at class time. Flowsheet Row Cardiac Rehab from 12/18/2023 in [facility] Cardiac and Pulmonary Rehab  Date 11/13/23  Educator KB  Instruction Review Code 1- Verbalizes Understanding    Infection Prevention: - Provides verbal and written material to individual with discussion of infection control including proper hand washing and proper equipment cleaning during exercise session. Flowsheet Row Cardiac Rehab from 12/18/2023 in [facility] Cardiac and Pulmonary Rehab  Date 11/11/23  Educator MB  Instruction Review Code 1- Verbalizes Understanding    Falls Prevention: - Provides verbal and written material to individual with discussion of falls prevention and safety. Flowsheet Row Cardiac Rehab from 12/18/2023 in [facility] Cardiac and Pulmonary Rehab  Date 11/11/23  Educator MB  Instruction Review Code 1- Verbalizes Understanding    Other: -Provides group and verbal instruction on various topics (see comments)   Knowledge Questionnaire Score:  Knowledge Questionnaire Score - 11/11/23 1155       Knowledge Questionnaire Score   Pre Score 26/26          Core Components/Risk Factors/Patient Goals at Admission:  Personal Goals and Risk Factors at Admission - 11/11/23 1155       Core Components/Risk Factors/Patient Goals on Admission    Weight Management Yes;Weight Maintenance    Intervention Weight Management: Develop a combined nutrition and exercise program designed to reach desired caloric intake, while maintaining appropriate intake of nutrient and fiber,  sodium and fats, and appropriate energy expenditure required for the weight goal.;Weight Management: Provide education and appropriate resources to help participant work on and attain dietary goals.;Weight Management/Obesity: Establish reasonable short term and long term weight goals.    Admit Weight  185 lb 6.4 oz (84.1 kg)    Goal Weight: Short Term 185 lb 6.4 oz (84.1 kg)    Goal Weight: Long Term 185 lb 6.4 oz (84.1 kg)    Expected Outcomes Short Term: Continue to assess and modify interventions until short term weight is achieved;Long Term: Adherence to nutrition and physical activity/exercise program aimed toward attainment of established weight goal;Weight Maintenance: Understanding of the daily nutrition guidelines, which includes 25-35% calories from fat, 7% or less cal from saturated fats, less than 200mg  cholesterol, less than 1.5gm of sodium, & 5 or more servings of fruits and vegetables daily;Understanding recommendations for meals to include 15-35% energy as protein, 25-35% energy from fat, 35-60% energy from carbohydrates, less than 200mg  of dietary cholesterol, 20-35 gm of total fiber daily;Understanding of distribution of calorie intake throughout the day with the consumption of 4-5 meals/snacks    Diabetes Yes    Intervention Provide education about signs/symptoms and action to take for hypo/hyperglycemia.;Provide education about proper nutrition, including hydration, and aerobic/resistive exercise prescription along with prescribed medications to achieve blood glucose in normal ranges: Fasting glucose 65-99 mg/dL    Expected Outcomes Short Term: Participant verbalizes understanding of the signs/symptoms and immediate care of hyper/hypoglycemia, proper foot care and importance of medication, aerobic/resistive exercise and nutrition plan for blood glucose control.;Long Term: Attainment of HbA1C < 7%.    Hypertension Yes    Intervention Provide education on lifestyle modifcations including regular physical activity/exercise, weight management, moderate sodium restriction and increased consumption of fresh fruit, vegetables, and low fat dairy, alcohol moderation, and smoking cessation.;Monitor prescription use compliance.    Expected Outcomes Long Term: Maintenance of blood pressure at  goal levels.;Short Term: Continued assessment and intervention until BP is < 140/43mm HG in hypertensive participants. < 130/30mm HG in hypertensive participants with diabetes, heart failure or chronic kidney disease.    Lipids Yes    Intervention Provide education and support for participant on nutrition & aerobic/resistive exercise along with prescribed medications to achieve LDL 70mg , HDL >40mg .    Expected Outcomes Short Term: Participant states understanding of desired cholesterol values and is compliant with medications prescribed. Participant is following exercise prescription and nutrition guidelines.;Long Term: Cholesterol controlled with medications as prescribed, with individualized exercise RX and with personalized nutrition plan. Value goals: LDL < 70mg , HDL > 40 mg.          Education:Diabetes - Individual verbal and written instruction to review signs/symptoms of diabetes, desired ranges of glucose level fasting, after meals and with exercise. Acknowledge that pre and post exercise glucose checks will be done for 3 sessions at entry of program. Flowsheet Row Cardiac Rehab from 12/18/2023 in [facility] Cardiac and Pulmonary Rehab  Date 11/11/23  Educator MB  Instruction Review Code 1- Verbalizes Understanding    Core Components/Risk Factors/Patient Goals Review:   Goals and Risk Factor Review     Row Name 12/18/23 1113             Core Components/Risk Factors/Patient Goals Review   Personal Goals Review Hypertension       Review Leonte reports he doesnt check his BP as much as he used to, because it is checked here often, but he does have the ability to check his BP  at home, if he needs to. Encouraged him to check it at least once per day to make sure the habit does not fade with time.       Expected Outcomes STG: Check BP at home once daily. LTG: Manage risk factors daily          Core Components/Risk Factors/Patient Goals at Discharge (Final Review):   Goals and Risk  Factor Review - 12/18/23 1113       Core Components/Risk Factors/Patient Goals Review   Personal Goals Review Hypertension    Review Jensen reports he doesnt check his BP as much as he used to, because it is checked here often, but he does have the ability to check his BP at home, if he needs to. Encouraged him to check it at least once per day to make sure the habit does not fade with time.    Expected Outcomes STG: Check BP at home once daily. LTG: Manage risk factors daily          ITP Comments:  ITP Comments     Row Name 11/05/23 1414 11/11/23 1147 11/13/23 1114 11/27/23 1014 12/25/23 0856   ITP Comments Initial phone call completed. Diagnosis can be found in [facility] 7/5. EP Orientation scheduled for Monday 9/8 10am. Completed and gym orientation for cardiac rehab. Initial ITP created and sent for review to Dr. Oneil Pinal, Medical Director. First full day of exercise!  Patient was oriented to gym and equipment including functions, settings, policies, and procedures.  Patient's individual exercise prescription and treatment plan were reviewed.  All starting workloads were established based on the results of the 6 minute walk test done at initial orientation visit.  The plan for exercise progression was also introduced and progression will be customized based on patient's performance and goals. 30 Day review completed. Medical Director ITP review done, changes made as directed, and signed approval by Medical Director. new to program. 30 Day review completed. Medical Director ITP review done, changes made as directed, and signed approval by Medical Director.      Comments: 30 day review

## 2023-12-27 ENCOUNTER — Encounter: Admitting: Emergency Medicine

## 2023-12-27 DIAGNOSIS — I214 Non-ST elevation (NSTEMI) myocardial infarction: Secondary | ICD-10-CM

## 2023-12-27 DIAGNOSIS — Z955 Presence of coronary angioplasty implant and graft: Secondary | ICD-10-CM

## 2023-12-27 NOTE — Progress Notes (Signed)
 Daily Session Note  Patient Details  Name: Jeremy Luna MRN: [id number] Date of Birth: [birth year] Referring Provider:   Flowsheet Row Cardiac Rehab from 11/11/2023 in [facility] Cardiac and Pulmonary Rehab  Referring Provider Tobie Rowan, MD    Encounter Date: 12/27/2023  Check In:  Session Check In - 12/27/23 1112       Check-In   Supervising physician immediately available to respond to emergencies See telemetry face sheet for immediately available ER MD    Location [facility]-Cardiac & Pulmonary Rehab    Staff Present Devaughn Jaeger, BS, Exercise Physiologist;Laurenashley Viar RN,BSN;Joseph Hood RCP,RRT,BSRT;Maxon Paragonah BS, Exercise Physiologist    Virtual Visit No    Medication changes reported     No    Fall or balance concerns reported    No    Warm-up and Cool-down Performed on first and last piece of equipment    Resistance Training Performed Yes    VAD Patient? No    PAD/SET Patient? No      Pain Assessment   Currently in Pain? No/denies             Social History   Tobacco Use  Smoking Status Never  Smokeless Tobacco Never    Goals Met:  Independence with exercise equipment Exercise tolerated well No report of concerns or symptoms today Strength training completed today  Goals Unmet:  Not Applicable  Comments: Pt able to follow exercise prescription today without complaint.  Will continue to monitor for progression.    Dr. Oneil Pinal is Medical Director for [facility] Cardiac Rehabilitation.  Dr. Fuad Aleskerov is Medical Director for [facility] Pulmonary Rehabilitation.

## 2023-12-30 ENCOUNTER — Encounter

## 2023-12-30 DIAGNOSIS — Z955 Presence of coronary angioplasty implant and graft: Secondary | ICD-10-CM

## 2023-12-30 DIAGNOSIS — I214 Non-ST elevation (NSTEMI) myocardial infarction: Secondary | ICD-10-CM | POA: Diagnosis not present

## 2023-12-30 NOTE — Progress Notes (Signed)
 Daily Session Note  Patient Details  Name: ARAN MENNING MRN: [id number] Date of Birth: 10/17/1955 Referring Provider:   Flowsheet Row Cardiac Rehab from 11/11/2023 in [facility] Cardiac and Pulmonary Rehab  Referring Provider Tobie Rowan, MD    Encounter Date: 12/30/2023  Check In:  Session Check In - 12/30/23 1102       Check-In   Supervising physician immediately available to respond to emergencies See telemetry face sheet for immediately available ER MD    Location [facility]-Cardiac & Pulmonary Rehab    Staff Present Burnard Davenport RN,BSN,MPA;Joseph Rolinda RCP,RRT,BSRT;Laura Cates RN,BSN;Ayaansh Smail Dyane BS, ACSM CEP, Exercise Physiologist    Virtual Visit No    Medication changes reported     No    Fall or balance concerns reported    No    Warm-up and Cool-down Performed on first and last piece of equipment    Resistance Training Performed Yes    VAD Patient? No    PAD/SET Patient? No      Pain Assessment   Currently in Pain? No/denies             Social History   Tobacco Use  Smoking Status Never  Smokeless Tobacco Never    Goals Met:  Proper associated with RPD/PD & O2 Sat Independence with exercise equipment Exercise tolerated well No report of concerns or symptoms today Strength training completed today  Goals Unmet:  Not Applicable  Comments: Pt able to follow exercise prescription today without complaint.  Will continue to monitor for progression.    Dr. Oneil Pinal is Medical Director for [facility] Cardiac Rehabilitation.  Dr. Fuad Aleskerov is Medical Director for [facility] Pulmonary Rehabilitation.

## 2024-01-01 ENCOUNTER — Encounter: Admitting: Emergency Medicine

## 2024-01-01 DIAGNOSIS — I214 Non-ST elevation (NSTEMI) myocardial infarction: Secondary | ICD-10-CM | POA: Diagnosis not present

## 2024-01-01 DIAGNOSIS — Z955 Presence of coronary angioplasty implant and graft: Secondary | ICD-10-CM

## 2024-01-01 NOTE — Progress Notes (Signed)
 Daily Session Note  Patient Details  Name: Jeremy Luna MRN: [id number] Date of Birth: 1955/03/09 Referring Provider:   Flowsheet Row Cardiac Rehab from 11/11/2023 in [facility] Cardiac and Pulmonary Rehab  Referring Provider Tobie Rowan, MD    Encounter Date: 01/01/2024  Check In:  Session Check In - 01/01/24 1122       Check-In   Supervising physician immediately available to respond to emergencies See telemetry face sheet for immediately available ER MD    Location [facility]-Cardiac & Pulmonary Rehab    Staff Present Rollene Paterson, MS, Exercise Physiologist;Noah Tickle, BS, Exercise Physiologist;Linzie Boursiquot RN,BSN;Joseph Rolinda RCP,RRT,BSRT    Virtual Visit No    Medication changes reported     No    Fall or balance concerns reported    No    Warm-up and Cool-down Performed on first and last piece of equipment    Resistance Training Performed Yes    VAD Patient? No    PAD/SET Patient? No      Pain Assessment   Currently in Pain? No/denies             Social History   Tobacco Use  Smoking Status Never  Smokeless Tobacco Never    Goals Met:  Independence with exercise equipment Exercise tolerated well No report of concerns or symptoms today Strength training completed today  Goals Unmet:  Not Applicable  Comments: Pt able to follow exercise prescription today without complaint.  Will continue to monitor for progression.    Dr. Oneil Pinal is Medical Director for [facility] Cardiac Rehabilitation.  Dr. Fuad Aleskerov is Medical Director for [facility] Pulmonary Rehabilitation.

## 2024-01-03 ENCOUNTER — Encounter: Admitting: Emergency Medicine

## 2024-01-03 VITALS — Ht 71.26 in | Wt 184.5 lb

## 2024-01-03 DIAGNOSIS — I214 Non-ST elevation (NSTEMI) myocardial infarction: Secondary | ICD-10-CM | POA: Diagnosis not present

## 2024-01-03 DIAGNOSIS — Z955 Presence of coronary angioplasty implant and graft: Secondary | ICD-10-CM

## 2024-01-03 NOTE — Patient Instructions (Signed)
 Discharge Patient Instructions  Patient Details  Name: Jeremy Luna MRN: [id number] Date of Birth: Dec 02, 1955 Referring Provider:  Cleotilde Oneil FALCON, MD   Number of Visits: 36  Reason for Discharge:  Patient reached a stable level of exercise. Patient independent in their exercise. Patient has met program and personal goals.  Diagnosis:  Status post coronary artery stent placement  NSTEMI (non-ST elevated myocardial infarction) [facility])  Initial Exercise Prescription:  Initial Exercise Prescription - 11/11/23 1100       Date of Initial Exercise RX and Referring Provider   Date 11/11/23    Referring Provider Tobie Rowan, MD      Oxygen   Maintain Oxygen Saturation 88% or higher      Treadmill   MPH 2.7    Grade 0    Minutes 15    METs 3.07      Recumbant Bike   Level 3    RPM 50    Watts 25    Minutes 15    METs 3.29      REL-XR   Level 2    Speed 50    Minutes 15    METs 3.29      T5 Nustep   Level 3    SPM 80    Minutes 15    METs 3.29      Track   Laps 38    Minutes 15    METs 3.07      Prescription Details   Frequency (times per week) 3    Duration Progress to 30 minutes of continuous aerobic without signs/symptoms of physical distress      Intensity   THRR 40-80% of Max Heartrate 93-131    Ratings of Perceived Exertion 11-13    Perceived Dyspnea 0-4      Progression   Progression Continue to progress workloads to maintain intensity without signs/symptoms of physical distress.      Resistance Training   Training Prescription Yes    Weight 5 lb    Reps 10-15          Discharge Exercise Prescription (Final Exercise Prescription Changes):  Exercise Prescription Changes - 12/31/23 1100       Response to Exercise   Blood Pressure (Admit) 118/60    Blood Pressure (Exit) 122/70    Heart Rate (Admit) 63 bpm    Heart Rate (Exercise) 96 bpm    Heart Rate (Exit) 70 bpm    Oxygen Saturation (Admit) 97 %    Oxygen Saturation (Exercise) 94  %    Oxygen Saturation (Exit) 96 %    Rating of Perceived Exertion (Exercise) 15    Symptoms none    Duration Continue with 30 min of aerobic exercise without signs/symptoms of physical distress.    Intensity THRR unchanged      Progression   Progression Continue to progress workloads to maintain intensity without signs/symptoms of physical distress.    Average METs 3.57      Resistance Training   Training Prescription Yes    Weight 5 lb    Reps 10-15      Interval Training   Interval Training No      Treadmill   MPH 3    Grade 0    Minutes 15    METs 3.3      Recumbant Bike   Level 3.2    Watts 25    Minutes 15    METs 2.94      REL-XR  Level 9    Minutes 15    METs 5.3      T5 Nustep   Level 5    Minutes 15    METs 3.7      Oxygen   Maintain Oxygen Saturation 88% or higher          Functional Capacity:  6 Minute Walk     Row Name 11/11/23 1147 01/03/24 1117       6 Minute Walk   Phase Initial Discharge    Distance 1440 feet 1580 feet    Distance % Change -- 9.7 %    Distance Feet Change -- 140 ft    Walk Time 6 minutes 6 minutes    # of Rest Breaks 0 0    MPH 2.73 2.99    METS 3.29 3.62    RPE 12 12    Perceived Dyspnea  1 1    VO2 Peak 11.53 12.67    Symptoms No Yes (comment)    Comments -- chest tightness    Resting HR 55 bpm 59 bpm    Resting BP 108/62 126/62    Resting Oxygen Saturation  99 % 97 %    Exercise Oxygen Saturation  during 6 min walk 99 % 97 %    Max Ex. HR 90 bpm 92 bpm    Max Ex. BP 140/72 148/64    2 Minute Post BP 118/60 --      Nutrition & Weight - Outcomes:  Pre Biometrics - 11/11/23 1152       Pre Biometrics   Height 5' 11.26 (1.81 m)    Weight 185 lb 6.4 oz (84.1 kg)    Waist Circumference 37.8 inches    Hip Circumference 40.5 inches    Waist to Hip Ratio 0.93 %    BMI (Calculated) 25.67    Single Leg Stand 30 seconds          Post Biometrics - 01/03/24 1118        Post  Biometrics   Height 5'  11.26 (1.81 m)    Weight 184 lb 8 oz (83.7 kg)    Waist Circumference 35 inches    Hip Circumference 39 inches    Waist to Hip Ratio 0.9 %    BMI (Calculated) 25.55          Nutrition:  Nutrition Therapy & Goals - 11/25/23 1341       Nutrition Therapy   Diet Cardiac, Low na    Protein (specify units) 80    Fiber 30 grams    Whole Grain Foods 3 servings    Saturated Fats 15 max. grams    Fruits and Vegetables 5 servings/day    Sodium 2 grams      Personal Nutrition Goals   Nutrition Goal Read labels and reduce sodium intake to below 2300mg . Ideally 1500mg  per day.    Personal Goal #2 Reduce saturated fat, less than 12g per day. Replace bad fats for more heart healthy fats.    Personal Goal #3 Include more colorful produce, aim for 5-8 servings of fruits and veggies per day    Comments Patient drinking ~64oz of water daily. He is not drinking any caffeine anymore. He eats 2-3 meals per day. Reviewed his food recall. Educated on saturated fat and sodium in many of the foods he is eating. He has made a lot of changes from fast food to more home cooked meals. Provided mediterranean diet handout. Educated on types  of fats, sources, and how to read fact labels. Brainstormed several meals and snacks with foods he likes and will eat focusing on reducing saturated fat or sodium.      Intervention Plan   Intervention Prescribe, educate and counsel regarding individualized specific dietary modifications aiming towards targeted core components such as weight, hypertension, lipid management, diabetes, heart failure and other comorbidities.;Nutrition handout(s) given to patient.    Expected Outcomes Short Term Goal: Understand basic principles of dietary content, such as calories, fat, sodium, cholesterol and nutrients.;Short Term Goal: A plan has been developed with personal nutrition goals set during dietitian appointment.;Long Term Goal: Adherence to prescribed nutrition plan.

## 2024-01-03 NOTE — Progress Notes (Signed)
 Daily Session Note  Patient Details  Name: Jeremy Luna MRN: [id number] Date of Birth: [birth year] Referring Provider:   Flowsheet Row Cardiac Rehab from 11/11/2023 in [facility] Cardiac and Pulmonary Rehab  Referring Provider Tobie Rowan, MD    Encounter Date: 01/03/2024  Check In:  Session Check In - 01/03/24 1107       Check-In   Supervising physician immediately available to respond to emergencies See telemetry face sheet for immediately available ER MD    Location [facility]-Cardiac & Pulmonary Rehab    Staff Present Devaughn Jaeger, BS, Exercise Physiologist;Alaycia Eardley RN,BSN;Joseph Hood RCP,RRT,BSRT;Maxon [place] BS, Exercise Physiologist    Virtual Visit No    Medication changes reported     No    Fall or balance concerns reported    No    Warm-up and Cool-down Performed on first and last piece of equipment    Resistance Training Performed Yes    VAD Patient? No    PAD/SET Patient? No      Pain Assessment   Currently in Pain? No/denies             Social History   Tobacco Use  Smoking Status Never  Smokeless Tobacco Never    Goals Met:  Independence with exercise equipment Exercise tolerated well No report of concerns or symptoms today Strength training completed today  Goals Unmet:  Not Applicable  Comments: Pt able to follow exercise prescription today without complaint.  Will continue to monitor for progression.    Dr. Oneil Pinal is Medical Director for [facility] Cardiac Rehabilitation.  Dr. Fuad Aleskerov is Medical Director for [facility] Pulmonary Rehabilitation.

## 2024-01-06 ENCOUNTER — Encounter: Attending: Internal Medicine | Admitting: *Deleted

## 2024-01-06 DIAGNOSIS — Z955 Presence of coronary angioplasty implant and graft: Secondary | ICD-10-CM | POA: Diagnosis present

## 2024-01-06 DIAGNOSIS — I214 Non-ST elevation (NSTEMI) myocardial infarction: Secondary | ICD-10-CM | POA: Insufficient documentation

## 2024-01-06 NOTE — Progress Notes (Signed)
 Daily Session Note  Patient Details  Name: Jeremy Luna MRN: [id number] Date of Birth: [birth year] Referring Provider:   Flowsheet Row Cardiac Rehab from 11/11/2023 in [facility] Cardiac and Pulmonary Rehab  Referring Provider Tobie Rowan, MD    Encounter Date: 01/06/2024  Check In:  Session Check In - 01/06/24 1116       Check-In   Supervising physician immediately available to respond to emergencies See telemetry face sheet for immediately available ER MD    Location [facility]-Cardiac & Pulmonary Rehab    Staff Present Othel Durand, RN, BSN, CCRP;Laura Cates RN,BSN;Joseph Sanmina-sci BS, ACSM CEP, Exercise Physiologist    Virtual Visit No    Medication changes reported     No    Fall or balance concerns reported    No    Warm-up and Cool-down Performed on first and last piece of equipment    Resistance Training Performed Yes    VAD Patient? No    PAD/SET Patient? No      Pain Assessment   Currently in Pain? No/denies             Social History   Tobacco Use  Smoking Status Never  Smokeless Tobacco Never    Goals Met:  Independence with exercise equipment Exercise tolerated well No report of concerns or symptoms today  Goals Unmet:  Not Applicable  Comments: Pt able to follow exercise prescription today without complaint.  Will continue to monitor for progression.    Dr. Oneil Pinal is Medical Director for [facility] Cardiac Rehabilitation.  Dr. Fuad Aleskerov is Medical Director for [facility] Pulmonary Rehabilitation.

## 2024-01-08 ENCOUNTER — Encounter: Admitting: Emergency Medicine

## 2024-01-08 DIAGNOSIS — I214 Non-ST elevation (NSTEMI) myocardial infarction: Secondary | ICD-10-CM

## 2024-01-08 DIAGNOSIS — Z955 Presence of coronary angioplasty implant and graft: Secondary | ICD-10-CM | POA: Diagnosis not present

## 2024-01-08 NOTE — Progress Notes (Signed)
 Daily Session Note  Patient Details  Name: Jeremy Luna MRN: [id number] Date of Birth: Feb 14, 1956 Referring Provider:   Flowsheet Row Cardiac Rehab from 11/11/2023 in [facility] Cardiac and Pulmonary Rehab  Referring Provider Tobie Rowan, MD    Encounter Date: 01/08/2024  Check In:  Session Check In - 01/08/24 1111       Check-In   Supervising physician immediately available to respond to emergencies See telemetry face sheet for immediately available ER MD    Location [facility]-Cardiac & Pulmonary Rehab    Staff Present Leita Franks RN,BSN;[facility] BS, Exercise Physiologist;Margaret Best, MS, Exercise Physiologist    Virtual Visit No    Medication changes reported     No    Fall or balance concerns reported    No    Warm-up and Cool-down Performed on first and last piece of equipment    Resistance Training Performed Yes    VAD Patient? No    PAD/SET Patient? No      Pain Assessment   Currently in Pain? No/denies             Social History   Tobacco Use  Smoking Status Never  Smokeless Tobacco Never    Goals Met:  Independence with exercise equipment Exercise tolerated well No report of concerns or symptoms today Strength training completed today  Goals Unmet:  Not Applicable  Comments: Pt able to follow exercise prescription today without complaint.  Will continue to monitor for progression.    Dr. Oneil Pinal is Medical Director for [facility] Cardiac Rehabilitation.  Dr. Fuad Aleskerov is Medical Director for [facility] Pulmonary Rehabilitation.

## 2024-01-10 ENCOUNTER — Encounter: Admitting: Emergency Medicine

## 2024-01-10 DIAGNOSIS — Z955 Presence of coronary angioplasty implant and graft: Secondary | ICD-10-CM | POA: Diagnosis not present

## 2024-01-10 DIAGNOSIS — I214 Non-ST elevation (NSTEMI) myocardial infarction: Secondary | ICD-10-CM

## 2024-01-10 NOTE — Progress Notes (Signed)
 Daily Session Note  Patient Details  Name: Jeremy Luna MRN: [id number] Date of Birth: 1956/01/18 Referring Provider:   Flowsheet Row Cardiac Rehab from 11/11/2023 in [facility] Cardiac and Pulmonary Rehab  Referring Provider Tobie Rowan, MD    Encounter Date: 01/10/2024  Check In:  Session Check In - 01/10/24 1108       Check-In   Supervising physician immediately available to respond to emergencies See telemetry face sheet for immediately available ER MD    Location [facility]-Cardiac & Pulmonary Rehab    Staff Present Devaughn Jaeger, BS, Exercise Physiologist;Joseph Hood RCP,RRT,BSRT;Maxon Ralston BS, Exercise Physiologist;Bode Pieper RN,BSN    Virtual Visit No    Medication changes reported     No    Fall or balance concerns reported    No    Warm-up and Cool-down Performed on first and last piece of equipment    Resistance Training Performed Yes    VAD Patient? No    PAD/SET Patient? No      Pain Assessment   Currently in Pain? No/denies             Social History   Tobacco Use  Smoking Status Never  Smokeless Tobacco Never    Goals Met:  Independence with exercise equipment Exercise tolerated well No report of concerns or symptoms today Strength training completed today  Goals Unmet:  Not Applicable  Comments: Pt able to follow exercise prescription today without complaint.  Will continue to monitor for progression.    Dr. Oneil Pinal is Medical Director for [facility] Cardiac Rehabilitation.  Dr. Fuad Aleskerov is Medical Director for [facility] Pulmonary Rehabilitation.

## 2024-01-13 ENCOUNTER — Encounter

## 2024-01-13 DIAGNOSIS — I214 Non-ST elevation (NSTEMI) myocardial infarction: Secondary | ICD-10-CM

## 2024-01-13 DIAGNOSIS — Z955 Presence of coronary angioplasty implant and graft: Secondary | ICD-10-CM | POA: Diagnosis not present

## 2024-01-13 NOTE — Progress Notes (Signed)
 Cardiac Individual Treatment Plan  Patient Details  Name: Jeremy Luna MRN: [id number] Date of Birth: Sep 30, 1955 Referring Provider:   Flowsheet Row Cardiac Rehab from 11/11/2023 in [facility] Cardiac and Pulmonary Rehab  Referring Provider Tobie Rowan, MD    Initial Encounter Date:  Flowsheet Row Cardiac Rehab from 11/11/2023 in [facility] Cardiac and Pulmonary Rehab  Date 11/11/23    Visit Diagnosis: NSTEMI (non-ST elevated myocardial infarction) [facility])  Status post coronary artery stent placement  Patient's Home Medications on Admission:  Current Outpatient Medications:    ALPRAZolam (XANAX) 0.5 MG tablet, Take 0.5 mg by mouth at bedtime., Disp: , Rfl:    amLODipine (NORVASC) 2.5 MG tablet, Take 2.5 mg by mouth daily., Disp: , Rfl:    aspirin EC 81 MG tablet, Take 81 mg by mouth daily., Disp: , Rfl:    atorvastatin  (LIPITOR) 80 MG tablet, Take 80 mg by mouth daily., Disp: , Rfl:    baclofen  (LIORESAL ) 10 MG tablet, Take 1 tablet (10 mg total) by mouth 3 (three) times daily as needed for muscle spasms., Disp: 30 each, Rfl: 0   cephALEXin  (KEFLEX ) 500 MG capsule, Take 2 capsules (1,000 mg total) by mouth 2 (two) times daily., Disp: 28 capsule, Rfl: 0   clopidogrel (PLAVIX) 75 MG tablet, Take 75 mg by mouth daily., Disp: , Rfl:    HYDROcodone-acetaminophen  (NORCO/VICODIN) 5-325 MG tablet, Take 1 tablet by mouth every 6 (six) hours as needed., Disp: , Rfl:    lisinopril  (PRINIVIL ,ZESTRIL ) 20 MG tablet, Take 20 mg by mouth daily. , Disp: , Rfl:    MAGNESIUM  OXIDE PO, Take 500 mg by mouth daily., Disp: , Rfl:    metFORMIN  (GLUCOPHAGE ) 500 MG tablet, Take 500-1,000 mg by mouth 2 (two) times daily with a meal. Take 1000 mg by mouth in the morning and take 500 mg by mouth in the evening., Disp: , Rfl:    metoprolol succinate (TOPROL-XL) 25 MG 24 hr tablet, Take 12.5 mg by mouth daily., Disp: , Rfl:    nitroGLYCERIN  (NITROSTAT ) 0.4 MG SL tablet, Place 0.4 mg under the tongue every 5 (five) minutes as  needed for chest pain., Disp: , Rfl:    omeprazole (PRILOSEC) 20 MG capsule, Take 20 mg by mouth daily. , Disp: , Rfl:    ondansetron  (ZOFRAN  ODT) 4 MG disintegrating tablet, Take 1 tablet (4 mg total) by mouth every 8 (eight) hours as needed for nausea or vomiting., Disp: 20 tablet, Rfl: 0   predniSONE (DELTASONE) 20 MG tablet, Take by mouth., Disp: , Rfl:    sertraline (ZOLOFT) 50 MG tablet, Take 1 tablet by mouth daily., Disp: , Rfl:   Past Medical History: Past Medical History:  Diagnosis Date   Benign essential hypertension 04/02/2016   CAD (coronary artery disease)    Carotid artery disease    stent PCI 11/1999, antioplasty 2/02, persistent chest pain   Cholelithiasis    Coronary artery disease 12/15/2010   Overview:   Progressive angina - onset early 2001.   08/1999 Worsening angina, stent PCI of the RCA ([place]).   11/1999 Recurrent angina, stent PCI of the LAD ([place]).   03/2000 Persistent intermittent chest discomfort Relook catheterization revealing in-stent restenosis of the LAD.   04/2000 Redo angioplasty of the LAD.   Persistent intermittent chest pain.   09/11/2000 Relook catheterizat   Diabetes mellitus without complication (HCC)    Dyslipidemia    GERD (gastroesophageal reflux disease)    H/O angioplasty    Hypertension  Leg pain 07/27/2013   Overview:   12/2012 ABIs: R> 1.39, L. 1.40   Other dysphagia 06/22/2016   PONV (postoperative nausea and vomiting)    pt also says he is hard to wake up    Schatzki's ring    Symptomatic cholelithiasis    Tubular adenoma 04/02/2016   Overview:  6/132    Tobacco Use: Social History   Tobacco Use  Smoking Status Never  Smokeless Tobacco Never    Labs: Review Flowsheet       Latest Ref Rng & Units 05/07/2018  Labs for ITP Cardiac and Pulmonary Rehab  Hemoglobin A1c 4.8 - 5.6 % 6.5      Exercise Target Goals: Exercise Program Goal: Individual exercise prescription set using results from initial 6 min walk test  and THRR while considering  patient's activity barriers and safety.   Exercise Prescription Goal: Initial exercise prescription builds to 30-45 minutes a day of aerobic activity, 2-3 days per week.  Home exercise guidelines will be given to patient during program as part of exercise prescription that the participant will acknowledge.   Education: Aerobic Exercise: - Group verbal and visual presentation on the components of exercise prescription. Introduces F.I.T.T principle from ACSM for exercise prescriptions.  Reviews F.I.T.T. principles of aerobic exercise including progression. Written material provided at class time. Flowsheet Row Cardiac Rehab from 01/08/2024 in [facility] Cardiac and Pulmonary Rehab  Date 12/18/23  Educator nt  Instruction Review Code 1- Tefl Teacher Understanding    Education: Resistance Exercise: - Group verbal and visual presentation on the components of exercise prescription. Introduces F.I.T.T principle from ACSM for exercise prescriptions  Reviews F.I.T.T. principles of resistance exercise including progression. Written material provided at class time. Flowsheet Row Cardiac Rehab from 01/08/2024 in [facility] Cardiac and Pulmonary Rehab  Date 12/11/23  Educator nt  Instruction Review Code 1- Tefl Teacher Understanding     Education: Exercise & Equipment Safety: - Individual verbal instruction and demonstration of equipment use and safety with use of the equipment. Flowsheet Row Cardiac Rehab from 01/08/2024 in [facility] Cardiac and Pulmonary Rehab  Date 11/11/23  Educator MB  Instruction Review Code 1- Verbalizes Understanding    Education: Exercise Physiology & General Exercise Guidelines: - Group verbal and written instruction with models to review the exercise physiology of the cardiovascular system and associated critical values. Provides general exercise guidelines with specific guidelines to those with heart or lung disease. Written material provided at class  time.   Education: Flexibility, Balance, Mind/Body Relaxation: - Group verbal and visual presentation with interactive activity on the components of exercise prescription. Introduces F.I.T.T principle from ACSM for exercise prescriptions. Reviews F.I.T.T. principles of flexibility and balance exercise training including progression. Also discusses the mind body connection.  Reviews various relaxation techniques to help reduce and manage stress (i.e. Deep breathing, progressive muscle relaxation, and visualization). Balance handout provided to take home. Written material provided at class time. Flowsheet Row Cardiac Rehab from 01/08/2024 in [facility] Cardiac and Pulmonary Rehab  Date 12/11/23  Educator nt  Instruction Review Code 1- Verbalizes Understanding    Activity Barriers & Risk Stratification:  Activity Barriers & Cardiac Risk Stratification - 11/11/23 1148       Activity Barriers & Cardiac Risk Stratification   Activity Barriers Left Knee Replacement;Joint Problems;Back Problems;Neck/Spine Problems    Cardiac Risk Stratification Moderate          6 Minute Walk:  6 Minute Walk     Row Name 11/11/23 1147 01/03/24 1117  6 Minute Walk   Phase Initial Discharge    Distance 1440 feet 1580 feet    Distance % Change -- 9.7 %    Distance Feet Change -- 140 ft    Walk Time 6 minutes 6 minutes    # of Rest Breaks 0 0    MPH 2.73 2.99    METS 3.29 3.62    RPE 12 12    Perceived Dyspnea  1 1    VO2 Peak 11.53 12.67    Symptoms No Yes (comment)    Comments -- chest tightness    Resting HR 55 bpm 59 bpm    Resting BP 108/62 126/62    Resting Oxygen Saturation  99 % 97 %    Exercise Oxygen Saturation  during 6 min walk 99 % 97 %    Max Ex. HR 90 bpm 92 bpm    Max Ex. BP 140/72 148/64    2 Minute Post BP 118/60 --       Oxygen Initial Assessment:   Oxygen Re-Evaluation:   Oxygen Discharge (Final Oxygen Re-Evaluation):   Initial Exercise Prescription:  Initial  Exercise Prescription - 11/11/23 1100       Date of Initial Exercise RX and Referring Provider   Date 11/11/23    Referring Provider Tobie Rowan, MD      Oxygen   Maintain Oxygen Saturation 88% or higher      Treadmill   MPH 2.7    Grade 0    Minutes 15    METs 3.07      Recumbant Bike   Level 3    RPM 50    Watts 25    Minutes 15    METs 3.29      REL-XR   Level 2    Speed 50    Minutes 15    METs 3.29      T5 Nustep   Level 3    SPM 80    Minutes 15    METs 3.29      Track   Laps 38    Minutes 15    METs 3.07      Prescription Details   Frequency (times per week) 3    Duration Progress to 30 minutes of continuous aerobic without signs/symptoms of physical distress      Intensity   THRR 40-80% of Max Heartrate 93-131    Ratings of Perceived Exertion 11-13    Perceived Dyspnea 0-4      Progression   Progression Continue to progress workloads to maintain intensity without signs/symptoms of physical distress.      Resistance Training   Training Prescription Yes    Weight 5 lb    Reps 10-15          Perform Capillary Blood Glucose checks as needed.  Exercise Prescription Changes:   Exercise Prescription Changes     Row Name 11/11/23 1100 11/19/23 1500 12/02/23 1100 12/19/23 0900 12/31/23 1100     Response to Exercise   Blood Pressure (Admit) 108/62 126/72 122/60 122/62 118/60   Blood Pressure (Exercise) 140/72 142/64 154/82 -- --   Blood Pressure (Exit) 118/60 120/60 112/50 120/68 122/70   Heart Rate (Admit) 55 bpm 65 bpm 70 bpm 85 bpm 63 bpm   Heart Rate (Exercise) 90 bpm 100 bpm 115 bpm 102 bpm 96 bpm   Heart Rate (Exit) 57 bpm 61 bpm 66 bpm 68 bpm 70 bpm   Oxygen Saturation (Admit) 99 % -- -- --  97 %   Oxygen Saturation (Exercise) 99 % -- -- -- 94 %   Oxygen Saturation (Exit) 98 % -- -- -- 96 %   Rating of Perceived Exertion (Exercise) 12 14 13 13 15    Perceived Dyspnea (Exercise) 1 -- -- -- --   Symptoms none none none none none    Comments results 1st week of exercise sessions -- -- --   Duration -- Progress to 30 minutes of  aerobic without signs/symptoms of physical distress Progress to 30 minutes of  aerobic without signs/symptoms of physical distress Continue with 30 min of aerobic exercise without signs/symptoms of physical distress. Continue with 30 min of aerobic exercise without signs/symptoms of physical distress.   Intensity -- THRR unchanged THRR unchanged THRR unchanged THRR unchanged     Progression   Progression -- Continue to progress workloads to maintain intensity without signs/symptoms of physical distress. Continue to progress workloads to maintain intensity without signs/symptoms of physical distress. Continue to progress workloads to maintain intensity without signs/symptoms of physical distress. Continue to progress workloads to maintain intensity without signs/symptoms of physical distress.   Average METs 3.29 3.04 3.08 3.21 3.57     Resistance Training   Training Prescription -- Yes Yes Yes Yes   Weight -- 5 lb 5 lb 5 lb 5 lb   Reps -- 10-15 10-15 10-15 10-15     Interval Training   Interval Training -- No No No No     Treadmill   MPH -- 2.7 3 2.7 3   Grade -- 0 -- 0 0   Minutes -- 15 15 15 15    METs -- 3.07 3.3 3.07 3.3     Recumbant Bike   Level -- -- -- -- 3.2   Watts -- -- -- -- 25   Minutes -- -- -- -- 15   METs -- -- -- -- 2.94     NuStep   Level -- -- -- 5  T6 nustep --   Minutes -- -- -- 15 --   METs -- -- -- 2.4 --     REL-XR   Level -- -- 6 9 9    Minutes -- -- 15 15 15    METs -- -- 4.1 6.2 5.3     T5 Nustep   Level -- 4 5 6 5    Minutes -- 15 15 15 15    METs -- 2.5 2.1 2.8 3.7     Oxygen   Maintain Oxygen Saturation -- 88% or higher 88% or higher 88% or higher 88% or higher    Row Name 01/06/24 1100             Home Exercise Plan   Plans to continue exercise at [employer] (comment)  Walking outside, looking into joining [employer] for  aerobic machines and weights       Frequency Add 2 additional days to program exercise sessions.       Initial Home Exercises Provided 01/06/24          Exercise Comments:   Exercise Comments     Row Name 11/13/23 1114 01/13/24 1118         Exercise Comments First full day of exercise!  Patient was oriented to gym and equipment including functions, settings, policies, and procedures.  Patient's individual exercise prescription and treatment plan were reviewed.  All starting workloads were established based on the results of the 6 minute walk test done at initial orientation visit.  The plan for  exercise progression was also introduced and progression will be customized based on patient's performance and goals. Napoleon graduated today from  rehab with 36 sessions completed.  Details of the patient's exercise prescription and what He needs to do in order to continue the prescription and progress were discussed with patient.  Patient was given a copy of prescription and goals.  Patient verbalized understanding. Davied plans to continue to exercise by walking at home and maybe joining a gym.         Exercise Goals and Review:   Exercise Goals     Row Name 11/11/23 1152             Exercise Goals   Increase Physical Activity Yes       Intervention Provide advice, education, support and counseling about physical activity/exercise needs.;Develop an individualized exercise prescription for aerobic and resistive training based on initial evaluation findings, risk stratification, comorbidities and participant's personal goals.       Expected Outcomes Short Term: Attend rehab on a regular basis to increase amount of physical activity.;Long Term: Add in home exercise to make exercise part of routine and to increase amount of physical activity.;Long Term: Exercising regularly at least 3-5 days a week.       Increase Strength and Stamina Yes       Intervention Provide advice, education, support and  counseling about physical activity/exercise needs.;Develop an individualized exercise prescription for aerobic and resistive training based on initial evaluation findings, risk stratification, comorbidities and participant's personal goals.       Expected Outcomes Short Term: Increase workloads from initial exercise prescription for resistance, speed, and METs.;Short Term: Perform resistance training exercises routinely during rehab and add in resistance training at home;Long Term: Improve cardiorespiratory fitness, muscular endurance and strength as measured by increased METs and functional capacity ( )       Able to understand and use rate of perceived exertion (RPE) scale Yes       Intervention Provide education and explanation on how to use RPE scale       Expected Outcomes Short Term: Able to use RPE daily in rehab to express subjective intensity level;Long Term:  Able to use RPE to guide intensity level when exercising independently       Able to understand and use Dyspnea scale Yes       Intervention Provide education and explanation on how to use Dyspnea scale       Expected Outcomes Short Term: Able to use Dyspnea scale daily in rehab to express subjective sense of shortness of breath during exertion;Long Term: Able to use Dyspnea scale to guide intensity level when exercising independently       Knowledge and understanding of Target Heart Rate Range (THRR) Yes       Intervention Provide education and explanation of THRR including how the numbers were predicted and where they are located for reference       Expected Outcomes Short Term: Able to state/look up THRR;Short Term: Able to use daily as guideline for intensity in rehab;Long Term: Able to use THRR to govern intensity when exercising independently       Able to check pulse independently Yes       Intervention Provide education and demonstration on how to check pulse in carotid and radial arteries.;Review the importance of being able to  check your own pulse for safety during independent exercise       Expected Outcomes Short Term: Able to explain why pulse checking  is important during independent exercise;Long Term: Able to check pulse independently and accurately       Understanding of Exercise Prescription Yes       Intervention Provide education, explanation, and written materials on patient's individual exercise prescription       Expected Outcomes Short Term: Able to explain program exercise prescription;Long Term: Able to explain home exercise prescription to exercise independently          Exercise Goals Re-Evaluation :  Exercise Goals Re-Evaluation     Row Name 11/13/23 1114 11/19/23 1510 12/02/23 1131 12/18/23 1105 12/19/23 0922     Exercise Goal Re-Evaluation   Exercise Goals Review Increase Physical Activity;Able to understand and use rate of perceived exertion (RPE) scale;Knowledge and understanding of Target Heart Rate Range (THRR);Understanding of Exercise Prescription;Increase Strength and Stamina;Able to understand and use Dyspnea scale;Able to check pulse independently Increase Physical Activity;Understanding of Exercise Prescription;Increase Strength and Stamina Increase Physical Activity;Understanding of Exercise Prescription;Increase Strength and Stamina Increase Physical Activity;Increase Strength and Stamina;Understanding of Exercise Prescription Increase Physical Activity;Increase Strength and Stamina;Understanding of Exercise Prescription   Comments Reviewed RPE and dyspnea scale, THR and program prescription with pt today.  Pt voiced understanding and was given a copy of goals to take home. Anurag is off to a good start in the program and completed his first week of exercise in this review. He tolerated his exercise prescription well. He had a workload on the treadmill of a speed of 2. and no incline. He also increased up to level 4 on the T5 nustep. We will continue to monitor his progress in the  program. Jaben continues to do well in rehab. He was recently able to increase his speed on the treadmill from 2.7mph to . He was also able to increase from level 2 to 6 on the XR. We will continue to monitor his progress in the program. Creek is doing well with rehab. He is walking on days off from rehab, getting around 30-66mins of exercise on top of being active around the house. Ezra is doing well in rehab. He continues to do well walking on the treadmill at a speed of 2.7 mph with no incline. He recently improved to level 6 on the T5 nustep and level 9 on the XR. We will continue to monitor his progress in the program.   Expected Outcomes Short: Use RPE daily to regulate intensity.  Long: Follow program prescription in THR. Short: Continue to follow current exercise prescription. Long: Continue exercise to improve strength and stamina. Short: Continue to follow current exercise prescription. Long: Continue exercise to improve strength and stamina. STG: Continue to exercise at home and attend rehab. LTG: Continue exercise to improve strength and stamina. Short: Begin to progressively increase treadmill workload. Long: Continue exercise to improve strength and stamina.    Row Name 12/31/23 1106 01/06/24 1139           Exercise Goal Re-Evaluation   Exercise Goals Review Increase Physical Activity;Increase Strength and Stamina;Understanding of Exercise Prescription Increase Physical Activity;Knowledge and understanding of Target Heart Rate Range (THRR);Able to understand and use rate of perceived exertion (RPE) scale;Understanding of Exercise Prescription;Increase Strength and Stamina;Able to understand and use Dyspnea scale;Able to check pulse independently      Comments Travious continues to do well in rehab. He is due for his post in the next review period and hopes to improve. He increased his workload on the treadmill to a speed of 3 mph and no incline. He  also increased to level 3.2 on  the recumbent bike. He maintained level 9 on the XR. We will continue to monitor his progress in the program. Reviewed home exercise with pt today.  Pt plans to walk outside and join [employer] for aerobic exercise machines and weights for exercise. He plans to start by adding 2 additional days at home.  Reviewed THR, pulse, RPE, sign and symptoms, pulse oximetery and when to call 911 or MD.  Also discussed weather considerations and indoor options.  Pt voiced understanding.      Expected Outcomes Short: Improve on post . Long: Continue to increase overall METs and stamina. Add 2 additional days of exercise at home. Long: Continue to exercise at home independently.         Discharge Exercise Prescription (Final Exercise Prescription Changes):  Exercise Prescription Changes - 01/06/24 1100       Home Exercise Plan   Plans to continue exercise at [facility] (comment)   Walking outside, looking into joining [employer] for aerobic machines and weights   Frequency Add 2 additional days to program exercise sessions.    Initial Home Exercises Provided 01/06/24          Nutrition:  Target Goals: Understanding of nutrition guidelines, daily intake of sodium 1500mg , cholesterol 200mg , calories 30% from fat and 7% or less from saturated fats, daily to have 5 or more servings of fruits and vegetables.  Education: Nutrition 1 -Group instruction provided by verbal, written material, interactive activities, discussions, models, and posters to present general guidelines for heart healthy nutrition including macronutrients, label reading, and promoting whole foods over processed counterparts. Education serves as pensions consultant of discussion of heart healthy eating for all. Written material provided at class time. Flowsheet Row Cardiac Rehab from 01/08/2024 in [facility] Cardiac and Pulmonary Rehab  Date 12/25/23  Educator jg  Instruction Review Code 1- Verbalizes Understanding     Education:  Nutrition 2 -Group instruction provided by verbal, written material, interactive activities, discussions, models, and posters to present general guidelines for heart healthy nutrition including sodium, cholesterol, and saturated fat. Providing guidance of habit forming to improve blood pressure, cholesterol, and body weight. Written material provided at class time. Flowsheet Row Cardiac Rehab from 01/08/2024 in [facility] Cardiac and Pulmonary Rehab  Date 01/01/24  Educator jg  Instruction Review Code 1- Verbalizes Understanding      Biometrics:  Pre Biometrics - 11/11/23 1152       Pre Biometrics   Height 5' 11.26 (1.81 m)    Weight 185 lb 6.4 oz (84.1 kg)    Waist Circumference 37.8 inches    Hip Circumference 40.5 inches    Waist to Hip Ratio 0.93 %    BMI (Calculated) 25.67    Single Leg Stand 30 seconds          Post Biometrics - 01/03/24 1118        Post  Biometrics   Height 5' 11.26 (1.81 m)    Weight 184 lb 8 oz (83.7 kg)    Waist Circumference 35 inches    Hip Circumference 39 inches    Waist to Hip Ratio 0.9 %    BMI (Calculated) 25.55          Nutrition Therapy Plan and Nutrition Goals:  Nutrition Therapy & Goals - 11/25/23 1341       Nutrition Therapy   Diet Cardiac, Low na    Protein (specify units) 80    Fiber 30 grams  Whole Grain Foods 3 servings    Saturated Fats 15 max. grams    Fruits and Vegetables 5 servings/day    Sodium 2 grams      Personal Nutrition Goals   Nutrition Goal Read labels and reduce sodium intake to below 2300mg . Ideally 1500mg  per day.    Personal Goal #2 Reduce saturated fat, less than 12g per day. Replace bad fats for more heart healthy fats.    Personal Goal #3 Include more colorful produce, aim for 5-8 servings of fruits and veggies per day    Comments Patient drinking ~64oz of water daily. He is not drinking any caffeine anymore. He eats 2-3 meals per day. Reviewed his food recall. Educated on saturated fat and sodium  in many of the foods he is eating. He has made a lot of changes from fast food to more home cooked meals. Provided mediterranean diet handout. Educated on types of fats, sources, and how to read fact labels. Brainstormed several meals and snacks with foods he likes and will eat focusing on reducing saturated fat or sodium.      Intervention Plan   Intervention Prescribe, educate and counsel regarding individualized specific dietary modifications aiming towards targeted core components such as weight, hypertension, lipid management, diabetes, heart failure and other comorbidities.;Nutrition handout(s) given to patient.    Expected Outcomes Short Term Goal: Understand basic principles of dietary content, such as calories, fat, sodium, cholesterol and nutrients.;Short Term Goal: A plan has been developed with personal nutrition goals set during dietitian appointment.;Long Term Goal: Adherence to prescribed nutrition plan.          Nutrition Assessments:  MEDIFICTS Score Key: >=70 Need to make dietary changes  40-70 Heart Healthy Diet <= 40 Therapeutic Level Cholesterol Diet  Flowsheet Row Cardiac Rehab from 01/08/2024 in [facility] Cardiac and Pulmonary Rehab  Picture Your Plate Total Score on Admission 51  Picture Your Plate Total Score on Discharge 57   Picture Your Plate Scores: <59 Unhealthy dietary pattern with much room for improvement. 41-50 Dietary pattern unlikely to meet recommendations for good health and room for improvement. 51-60 More healthful dietary pattern, with some room for improvement.  >60 Healthy dietary pattern, although there may be some specific behaviors that could be improved.    Nutrition Goals Re-Evaluation:  Nutrition Goals Re-Evaluation     Row Name 12/18/23 1111             Goals   Comment Tejon is doing better with nutrition since meeting with RD, he is eating less red meat and switched to more whole grain bread. Has been working on including more  veggies at meals       Expected Outcome STG: Continue to cut back on saturated fat rich foods and monitor sodium. LTG: Follow a heart healthy lifestyle          Nutrition Goals Discharge (Final Nutrition Goals Re-Evaluation):  Nutrition Goals Re-Evaluation - 12/18/23 1111       Goals   Comment Efrain is doing better with nutrition since meeting with RD, he is eating less red meat and switched to more whole grain bread. Has been working on including more veggies at meals    Expected Outcome STG: Continue to cut back on saturated fat rich foods and monitor sodium. LTG: Follow a heart healthy lifestyle          Psychosocial: Target Goals: Acknowledge presence or absence of significant depression and/or stress, maximize coping skills, provide positive support system. Participant  is able to verbalize types and ability to use techniques and skills needed for reducing stress and depression.   Education: Stress, Anxiety, and Depression - Group verbal and visual presentation to define topics covered.  Reviews how body is impacted by stress, anxiety, and depression.  Also discusses healthy ways to reduce stress and to treat/manage anxiety and depression. Written material provided at class time. Flowsheet Row Cardiac Rehab from 01/08/2024 in [facility] Cardiac and Pulmonary Rehab  Date 11/27/23  Educator mc  Instruction Review Code 1- Verbalizes Understanding    Education: Sleep Hygiene -Provides group verbal and written instruction about how sleep can affect your health.  Define sleep hygiene, discuss sleep cycles and impact of sleep habits. Review good sleep hygiene tips.   Initial Review & Psychosocial Screening:  Initial Psych Review & Screening - 11/05/23 1427       Initial Review   Current issues with None Identified      Family Dynamics   Good Support System? Yes      Barriers   Psychosocial barriers to participate in program There are no identifiable barriers or psychosocial needs.       Screening Interventions   Interventions Encouraged to exercise;Provide feedback about the scores to participant    Expected Outcomes Short Term goal: Utilizing psychosocial counselor, staff and physician to assist with identification of specific Stressors or current issues interfering with healing process. Setting desired goal for each stressor or current issue identified.;Long Term Goal: Stressors or current issues are controlled or eliminated.;Long Term goal: The participant improves quality of Life and PHQ9 Scores as seen by post scores and/or verbalization of changes;Short Term goal: Identification and review with participant of any Quality of Life or Depression concerns found by scoring the questionnaire.          Quality of Life Scores:   Quality of Life - 01/08/24 1125       Quality of Life Scores   Health/Function Pre 23.3 %    Health/Function Post 24.57 %    Health/Function % Change 5.45 %    Socioeconomic Pre 22.93 %    Socioeconomic Post 25.07 %    Socioeconomic % Change  9.33 %    Psych/Spiritual Pre 22.93 %    Psych/Spiritual Post 24.43 %    Psych/Spiritual % Change 6.54 %    Family Pre 25.8 %    Family Post 25.2 %    Family % Change -2.33 %    GLOBAL Pre 23.51 %    GLOBAL Post 24.74 %    GLOBAL % Change 5.23 %         Scores of 19 and below usually indicate a poorer quality of life in these areas.  A difference of  2-3 points is a clinically meaningful difference.  A difference of 2-3 points in the total score of the Quality of Life Index has been associated with significant improvement in overall quality of life, self-image, physical symptoms, and general health in studies assessing change in quality of life.  PHQ-9: Review Flowsheet       01/08/2024 11/11/2023  Depression screen PHQ 2/9  Decreased Interest 0 0  Down, Depressed, Hopeless 0 0  PHQ - 2 Score 0 0  Altered sleeping 0 0  Tired, decreased energy 0 0  Change in appetite 0 0  Feeling bad or  failure about yourself  0 0  Trouble concentrating 0 0  Moving slowly or fidgety/restless 0 0  Suicidal thoughts 0 0  PHQ-9  Score 0  0   Difficult doing work/chores Not difficult at all Not difficult at all    Details       Data saved with a previous flowsheet row definition        Interpretation of Total Score  Total Score Depression Severity:  1-4 = Minimal depression, 5-9 = Mild depression, 10-14 = Moderate depression, 15-19 = Moderately severe depression, 20-27 = Severe depression   Psychosocial Evaluation and Intervention:  Psychosocial Evaluation - 11/05/23 1427       Psychosocial Evaluation & Interventions   Interventions Encouraged to exercise with the program and follow exercise prescription    Comments Mr. 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 He states he doesn't feel like he has any more stress than anyone else out there, so he just takes it day by day. He has been walking more which helps boost his energy. He retired a little over a year ago and mentions that has taken time to get used to. He is curious to try the program and is ready to start.    Expected Outcomes Short: attend cardiac rehab for education and exercise Long: Develop and maintain positive self care habits    Continue Psychosocial Services  Follow up required by staff          Psychosocial Re-Evaluation:  Psychosocial Re-Evaluation     Row Name 12/18/23 1107             Psychosocial Re-Evaluation   Current issues with None Identified       Comments Sigfredo denies any anxiety, depression, or stress at this time. He reports he has a good support system. Says he wakes up in the middle of the night to use bathroom but falls back to sleep quickly.       Expected Outcomes STG: Continue to attend rehab consistently. LTG: Achieve and maintain a positive outlook on health and daily life       Interventions Encouraged to attend Cardiac Rehabilitation for the exercise       Continue  Psychosocial Services  Follow up required by staff          Psychosocial Discharge (Final Psychosocial Re-Evaluation):  Psychosocial Re-Evaluation - 12/18/23 1107       Psychosocial Re-Evaluation   Current issues with None Identified    Comments William denies any anxiety, depression, or stress at this time. He reports he has a good support system. Says he wakes up in the middle of the night to use bathroom but falls back to sleep quickly.    Expected Outcomes STG: Continue to attend rehab consistently. LTG: Achieve and maintain a positive outlook on health and daily life    Interventions Encouraged to attend Cardiac Rehabilitation for the exercise    Continue Psychosocial Services  Follow up required by staff          Vocational Rehabilitation: Provide vocational rehab assistance to qualifying candidates.   Vocational Rehab Evaluation & Intervention:  Vocational Rehab - 11/05/23 1417       Initial Vocational Rehab Evaluation & Intervention   Assessment shows need for Vocational Rehabilitation No          Education: Education Goals: Education classes will be provided on a variety of topics geared toward better understanding of heart health and risk factor modification. Participant will state understanding/return demonstration of topics presented as noted by education test scores.  Learning Barriers/Preferences:  Learning Barriers/Preferences - 11/05/23 1405  Learning Barriers/Preferences   Learning Barriers None    Learning Preferences None          General Cardiac Education Topics:  AED/CPR: - Group verbal and written instruction with the use of models to demonstrate the basic use of the AED with the basic ABC's of resuscitation.   Test and Procedures: - Group verbal and visual presentation and models provide information about basic cardiac anatomy and function. Reviews the testing methods done to diagnose heart disease and the outcomes of the test results.  Describes the treatment choices: Medical Management, Angioplasty, or Coronary Bypass Surgery for treating various heart conditions including Myocardial Infarction, Angina, Valve Disease, and Cardiac Arrhythmias. Written material provided at class time. Flowsheet Row Cardiac Rehab from 01/08/2024 in [facility] Cardiac and Pulmonary Rehab  Date 01/08/24  Educator lc  Instruction Review Code 1- Verbalizes Understanding    Medication Safety: - Group verbal and visual instruction to review commonly prescribed medications for heart and lung disease. Reviews the medication, class of the drug, and side effects. Includes the steps to properly store meds and maintain the prescription regimen. Written material provided at class time.   Intimacy: - Group verbal instruction through game format to discuss how heart and lung disease can affect sexual intimacy. Written material provided at class time. Flowsheet Row Cardiac Rehab from 01/08/2024 in [facility] Cardiac and Pulmonary Rehab  Date 12/18/23  Educator nt  Instruction Review Code 1- Tefl Teacher Understanding    Know Your Numbers and Heart Failure: - Group verbal and visual instruction to discuss disease risk factors for cardiac and pulmonary disease and treatment options.  Reviews associated critical values for Overweight/Obesity, Hypertension, Cholesterol, and Diabetes.  Discusses basics of heart failure: signs/symptoms and treatments.  Introduces Heart Failure Zone chart for action plan for heart failure. Written material provided at class time. Flowsheet Row Cardiac Rehab from 01/08/2024 in [facility] Cardiac and Pulmonary Rehab  Date 11/13/23  Educator KB  Instruction Review Code 1- Verbalizes Understanding    Infection Prevention: - Provides verbal and written material to individual with discussion of infection control including proper hand washing and proper equipment cleaning during exercise session. Flowsheet Row Cardiac Rehab from 01/08/2024 in [facility] Cardiac  and Pulmonary Rehab  Date 11/11/23  Educator MB  Instruction Review Code 1- Verbalizes Understanding    Falls Prevention: - Provides verbal and written material to individual with discussion of falls prevention and safety. Flowsheet Row Cardiac Rehab from 01/08/2024 in [facility] Cardiac and Pulmonary Rehab  Date 11/11/23  Educator MB  Instruction Review Code 1- Verbalizes Understanding    Other: -Provides group and verbal instruction on various topics (see comments)   Knowledge Questionnaire Score:  Knowledge Questionnaire Score - 01/08/24 1125       Knowledge Questionnaire Score   Pre Score 26/26    Post Score 26/26          Core Components/Risk Factors/Patient Goals at Admission:  Personal Goals and Risk Factors at Admission - 11/11/23 1155       Core Components/Risk Factors/Patient Goals on Admission    Weight Management Yes;Weight Maintenance    Intervention Weight Management: Develop a combined nutrition and exercise program designed to reach desired caloric intake, while maintaining appropriate intake of nutrient and fiber, sodium and fats, and appropriate energy expenditure required for the weight goal.;Weight Management: Provide education and appropriate resources to help participant work on and attain dietary goals.;Weight Management/Obesity: Establish reasonable short term and long term weight goals.    Admit Weight 185  lb 6.4 oz (84.1 kg)    Goal Weight: Short Term 185 lb 6.4 oz (84.1 kg)    Goal Weight: Long Term 185 lb 6.4 oz (84.1 kg)    Expected Outcomes Short Term: Continue to assess and modify interventions until short term weight is achieved;Long Term: Adherence to nutrition and physical activity/exercise program aimed toward attainment of established weight goal;Weight Maintenance: Understanding of the daily nutrition guidelines, which includes 25-35% calories from fat, 7% or less cal from saturated fats, less than 200mg  cholesterol, less than 1.5gm of sodium, & 5  or more servings of fruits and vegetables daily;Understanding recommendations for meals to include 15-35% energy as protein, 25-35% energy from fat, 35-60% energy from carbohydrates, less than 200mg  of dietary cholesterol, 20-35 gm of total fiber daily;Understanding of distribution of calorie intake throughout the day with the consumption of 4-5 meals/snacks    Diabetes Yes    Intervention Provide education about signs/symptoms and action to take for hypo/hyperglycemia.;Provide education about proper nutrition, including hydration, and aerobic/resistive exercise prescription along with prescribed medications to achieve blood glucose in normal ranges: Fasting glucose 65-99 mg/dL    Expected Outcomes Short Term: Participant verbalizes understanding of the signs/symptoms and immediate care of hyper/hypoglycemia, proper foot care and importance of medication, aerobic/resistive exercise and nutrition plan for blood glucose control.;Long Term: Attainment of HbA1C < 7%.    Hypertension Yes    Intervention Provide education on lifestyle modifcations including regular physical activity/exercise, weight management, moderate sodium restriction and increased consumption of fresh fruit, vegetables, and low fat dairy, alcohol moderation, and smoking cessation.;Monitor prescription use compliance.    Expected Outcomes Long Term: Maintenance of blood pressure at goal levels.;Short Term: Continued assessment and intervention until BP is < 140/12mm HG in hypertensive participants. < 130/51mm HG in hypertensive participants with diabetes, heart failure or chronic kidney disease.    Lipids Yes    Intervention Provide education and support for participant on nutrition & aerobic/resistive exercise along with prescribed medications to achieve LDL 70mg , HDL >40mg .    Expected Outcomes Short Term: Participant states understanding of desired cholesterol values and is compliant with medications prescribed. Participant is following  exercise prescription and nutrition guidelines.;Long Term: Cholesterol controlled with medications as prescribed, with individualized exercise RX and with personalized nutrition plan. Value goals: LDL < 70mg , HDL > 40 mg.          Education:Diabetes - Individual verbal and written instruction to review signs/symptoms of diabetes, desired ranges of glucose level fasting, after meals and with exercise. Acknowledge that pre and post exercise glucose checks will be done for 3 sessions at entry of program. Flowsheet Row Cardiac Rehab from 01/08/2024 in [facility] Cardiac and Pulmonary Rehab  Date 11/11/23  Educator MB  Instruction Review Code 1- Verbalizes Understanding    Core Components/Risk Factors/Patient Goals Review:   Goals and Risk Factor Review     Row Name 12/18/23 1113             Core Components/Risk Factors/Patient Goals Review   Personal Goals Review Hypertension       Review Bryor reports he doesnt check his BP as much as he used to, because it is checked here often, but he does have the ability to check his BP at home, if he needs to. Encouraged him to check it at least once per day to make sure the habit does not fade with time.       Expected Outcomes STG: Check BP at home once daily. LTG: Manage  risk factors daily          Core Components/Risk Factors/Patient Goals at Discharge (Final Review):   Goals and Risk Factor Review - 12/18/23 1113       Core Components/Risk Factors/Patient Goals Review   Personal Goals Review Hypertension    Review Carlitos reports he doesnt check his BP as much as he used to, because it is checked here often, but he does have the ability to check his BP at home, if he needs to. Encouraged him to check it at least once per day to make sure the habit does not fade with time.    Expected Outcomes STG: Check BP at home once daily. LTG: Manage risk factors daily          ITP Comments:  ITP Comments     Row Name 11/05/23 1414 11/11/23 1147  11/13/23 1114 11/27/23 1014 12/25/23 0856   ITP Comments Initial phone call completed. Diagnosis can be found in [facility] 7/5. EP Orientation scheduled for Monday 9/8 10am. Completed and gym orientation for cardiac rehab. Initial ITP created and sent for review to Dr. Oneil Pinal, Medical Director. First full day of exercise!  Patient was oriented to gym and equipment including functions, settings, policies, and procedures.  Patient's individual exercise prescription and treatment plan were reviewed.  All starting workloads were established based on the results of the 6 minute walk test done at initial orientation visit.  The plan for exercise progression was also introduced and progression will be customized based on patient's performance and goals. 30 Day review completed. Medical Director ITP review done, changes made as directed, and signed approval by Medical Director. new to program. 30 Day review completed. Medical Director ITP review done, changes made as directed, and signed approval by Medical Director.    Row Name 01/13/24 1117           ITP Comments Kyandre graduated today from  rehab with 36 sessions completed.  Details of the patient's exercise prescription and what He needs to do in order to continue the prescription and progress were discussed with patient.  Patient was given a copy of prescription and goals.  Patient verbalized understanding. Thorvald plans to continue to exercise by walking at home and maybe joining a gym.          Comments: discharge ITP

## 2024-01-13 NOTE — Progress Notes (Signed)
 Discharge Summary Patient: Jeremy Luna DOB: 08-Aug-1955  [name] graduated today from  rehab with 36 sessions completed.  Details of the patient's exercise prescription and what He needs to do in order to continue the prescription and progress were discussed with patient.  Patient was given a copy of prescription and goals.  Patient verbalized understanding. Merit plans to continue to exercise by walking at home and maybe joining a gym.   6 Minute Walk     Row Name 11/11/23 1147 01/03/24 1117       6 Minute Walk   Phase Initial Discharge    Distance 1440 feet 1580 feet    Distance % Change -- 9.7 %    Distance Feet Change -- 140 ft    Walk Time 6 minutes 6 minutes    # of Rest Breaks 0 0    MPH 2.73 2.99    METS 3.29 3.62    RPE 12 12    Perceived Dyspnea  1 1    VO2 Peak 11.53 12.67    Symptoms No Yes (comment)    Comments -- chest tightness    Resting HR 55 bpm 59 bpm    Resting BP 108/62 126/62    Resting Oxygen Saturation  99 % 97 %    Exercise Oxygen Saturation  during 6 min walk 99 % 97 %    Max Ex. HR 90 bpm 92 bpm    Max Ex. BP 140/72 148/64    2 Minute Post BP 118/60 --

## 2024-01-13 NOTE — Progress Notes (Signed)
 Daily Session Note  Patient Details  Name: ERVINE WITUCKI MRN: [id number] Date of Birth: Apr 18, 1955 Referring Provider:   Flowsheet Row Cardiac Rehab from 11/11/2023 in [facility] Cardiac and Pulmonary Rehab  Referring Provider Tobie Rowan, MD    Encounter Date: 01/13/2024  Check In:  Session Check In - 01/13/24 1108       Check-In   Supervising physician immediately available to respond to emergencies See telemetry face sheet for immediately available ER MD    Location [facility]-Cardiac & Pulmonary Rehab    Staff Present Burnard Hint BS, ACSM CEP, Exercise Physiologist;Joseph Rolinda RCP,RRT,BSRT;Laura Cates RN,BSN;Yola Paradiso RN,BSN,MPA;Mary Peggi, RN, DNP, NE-BC    Virtual Visit No    Medication changes reported     No    Fall or balance concerns reported    No    Warm-up and Cool-down Performed on first and last piece of equipment    Resistance Training Performed Yes    VAD Patient? No    PAD/SET Patient? No      Pain Assessment   Currently in Pain? No/denies             Social History   Tobacco Use  Smoking Status Never  Smokeless Tobacco Never    Goals Met:  Independence with exercise equipment Exercise tolerated well No report of concerns or symptoms today Strength training completed today  Goals Unmet:  Not Applicable  Comments:  Consuelo graduated today from  rehab with 36 sessions completed.  Details of the patient's exercise prescription and what He needs to do in order to continue the prescription and progress were discussed with patient.  Patient was given a copy of prescription and goals.  Patient verbalized understanding. Sylvan plans to continue to exercise by walking at home and maybe joining a gym.     Dr. Oneil Pinal is Medical Director for [facility] Cardiac Rehabilitation.  Dr. Fuad Aleskerov is Medical Director for [facility] Pulmonary Rehabilitation.

## 2024-01-15 ENCOUNTER — Encounter

## 2024-01-17 ENCOUNTER — Encounter

## 2024-01-20 ENCOUNTER — Encounter

## 2024-01-22 ENCOUNTER — Encounter

## 2024-01-24 ENCOUNTER — Encounter

## 2024-01-27 ENCOUNTER — Encounter

## 2024-01-29 ENCOUNTER — Encounter

## 2024-02-03 ENCOUNTER — Ambulatory Visit

## 2024-02-05 ENCOUNTER — Encounter

## 2024-02-07 ENCOUNTER — Encounter

## 2024-02-10 ENCOUNTER — Ambulatory Visit
